# Patient Record
Sex: Male | Born: 1985 | Race: White | Hispanic: No | Marital: Married | Smoking: Never smoker
Health system: Southern US, Community
[De-identification: ages and names within clinical notes are randomized; demographics above are authoritative.]

## PROBLEM LIST (undated history)

## (undated) DIAGNOSIS — K589 Irritable bowel syndrome without diarrhea: Secondary | ICD-10-CM

## (undated) HISTORY — PX: WISDOM TOOTH EXTRACTION: SHX21

## (undated) HISTORY — DX: Irritable bowel syndrome, unspecified: K58.9

---

## 2014-12-18 ENCOUNTER — Ambulatory Visit
Admit: 2014-12-18 | Disposition: A | Discharge status: Home / Self Care | Payer: Self-pay | Attending: Family Medicine | Admitting: Family Medicine

## 2015-03-02 ENCOUNTER — Ambulatory Visit (INDEPENDENT_AMBULATORY_CARE_PROVIDER_SITE_OTHER): Payer: BC Managed Care – PPO | Admitting: Family Medicine

## 2015-03-02 ENCOUNTER — Encounter: Payer: Self-pay | Admitting: Family Medicine

## 2015-03-02 VITALS — BP 110/66 | HR 72 | Ht 73.0 in | Wt 136.0 lb

## 2015-03-02 DIAGNOSIS — M2241 Chondromalacia patellae, right knee: Secondary | ICD-10-CM | POA: Diagnosis not present

## 2015-03-02 DIAGNOSIS — R1030 Lower abdominal pain, unspecified: Secondary | ICD-10-CM

## 2015-03-02 DIAGNOSIS — M75102 Unspecified rotator cuff tear or rupture of left shoulder, not specified as traumatic: Secondary | ICD-10-CM

## 2015-03-02 DIAGNOSIS — R636 Underweight: Secondary | ICD-10-CM

## 2015-03-02 DIAGNOSIS — M79673 Pain in unspecified foot: Secondary | ICD-10-CM

## 2015-03-02 DIAGNOSIS — R1032 Left lower quadrant pain: Secondary | ICD-10-CM

## 2015-03-02 NOTE — Progress Notes (Signed)
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 Seen [facility] 1 month ago for L shoulder pain, told bursitis, given cortisone shot without improvement, still with decreased ROM. C/o persistent R groin pain s/p groin pull, unable to fully flex hip without pain. R knee bothers at times esp with flexion. C/o B plantar foot pain unresposive to standard inserts. Concerned re: inability to gain weight, has been skinny all of life, father and brothers both skinny until middle age.  Review of Systems:  Review of Systems  Constitutional: Negative for fever, activity change, appetite change and fatigue.  HENT: Negative for ear pain and sore throat.   Eyes: Negative for pain.  Respiratory: Negative for shortness of breath.   Cardiovascular: Negative for chest pain and leg swelling.  Gastrointestinal: Negative for abdominal pain.  Genitourinary: Negative for frequency and difficulty urinating.  Skin: Negative for rash.  Neurological: Negative for tremors and syncope.  Hematological: Negative for adenopathy.  Psychiatric/Behavioral: Negative for decreased concentration.    There are no active problems to display for this patient.   Prior to Admission medications   Not on File    Not on File  No past surgical history on file.  History  Substance Use Topics  . Smoking status: Never Smoker   . Smokeless tobacco: Not on file  . Alcohol Use: Not on file    No family history on file.  Medication list has been reviewed and updated.  Physical Examination: BP 110/66 mmHg  Pulse 72  Ht 6\' 1"  (1.854 m)  Wt 136 lb (61.689 kg)  BMI 17.95 kg/m2  Physical Exam  Constitutional: He appears well-developed and well-nourished. No distress.  HENT:  Head: Normocephalic and atraumatic.  Right Ear:  External ear normal.  Left Ear: External ear normal.  Mouth/Throat: Oropharynx is clear and moist.  Eyes: Conjunctivae and EOM are normal. Pupils are equal, round, and reactive to light.  Neck: Neck supple. No thyromegaly present.  Cardiovascular: Normal rate, regular rhythm and normal heart sounds.   Pulmonary/Chest: Effort normal and breath sounds normal.  Abdominal: Soft. He exhibits no distension and no mass. There is no tenderness.  Musculoskeletal: He exhibits no edema.  Unable to abduct L shoulder past 90 deg without pain, mild tenderness over biceps insertion, R knee stable, some pain with inferior patellar movement  Lymphadenopathy:    He has no cervical adenopathy.    Assessment and Plan:  1. Rotator cuff syndrome of left shoulder Unresponsive cortisone shot at [facility] - Ambulatory referral to Physical Therapy  2. Left groin pain - Ambulatory referral to Physical Therapy  3. Chondromalacia patellae of right knee - Ambulatory referral to Physical Therapy  4. Foot arch pain, unspecified laterality - Ambulatory referral to Podiatry  5. Underweight Likely genetic, has had blood done multiple times  Return in about 6 months (around 09/01/2015).  Dionne Ano. Kingsley Spittle MD [facility]  03/02/2015

## 2015-04-02 ENCOUNTER — Encounter: Payer: BC Managed Care – PPO | Admitting: Family Medicine

## 2015-04-03 ENCOUNTER — Ambulatory Visit: Payer: BC Managed Care – PPO | Attending: Family Medicine | Admitting: Physical Therapy

## 2015-04-03 DIAGNOSIS — M25612 Stiffness of left shoulder, not elsewhere classified: Secondary | ICD-10-CM | POA: Diagnosis not present

## 2015-04-03 DIAGNOSIS — M25512 Pain in left shoulder: Secondary | ICD-10-CM | POA: Insufficient documentation

## 2015-04-03 DIAGNOSIS — M6281 Muscle weakness (generalized): Secondary | ICD-10-CM | POA: Insufficient documentation

## 2015-04-03 DIAGNOSIS — R1032 Left lower quadrant pain: Secondary | ICD-10-CM

## 2015-04-03 DIAGNOSIS — R1030 Lower abdominal pain, unspecified: Secondary | ICD-10-CM | POA: Diagnosis not present

## 2015-04-04 ENCOUNTER — Encounter: Payer: Self-pay | Admitting: Physical Therapy

## 2015-04-04 NOTE — Therapy (Signed)
[facility] [address] [place], [place], [address] Phone: [phone]   Fax:  [phone]  Physical Therapy Evaluation  Patient Details  Name: Nathan Guerrero MRN: [id number] Date of Birth: [birth year] Referring Provider:  Schuyler Amor, MD  Encounter Date: 04/03/2015      PT End of Session - 04/04/15 1716    Visit Number 1   Number of Visits 8   Date for PT Re-Evaluation 05/01/15   PT Start Time 0809   PT Stop Time 0917   PT Time Calculation (min) 68 min   Activity Tolerance Patient tolerated treatment well;Patient limited by pain   Behavior During Therapy [facility] for tasks assessed/performed      History reviewed. No pertinent past medical history.  History reviewed. No pertinent past surgical history.  There were no vitals filed for this visit.  Visit Diagnosis:  Left shoulder pain  Muscle weakness  Stiffness of joint, shoulder region, left  Left groin pain      Subjective Assessment - 04/04/15 1712    Subjective Pts. primary complaint at this time is L shoulder pain (6/10).  Pt. reports no know MOI but pain started end of May and has progressively worsened.  Pt. states "even picking up a glass of water" hurts shoulder.  Pt. also has c/o L groin pain and no c/o B knee pain at this time.    Limitations Lifting;Standing;Walking;House hold activities;Other (comment)   Diagnostic tests No MRI   Patient Stated Goals Increase L shoulder ROM/ stability/ pain mgmt.     Currently in Pain? Yes   Pain Score 6    Pain Location Shoulder   Pain Orientation Left   Pain Descriptors / Indicators Sharp   Pain Type Chronic pain   Pain Onset More than a month ago   Pain Frequency Intermittent   Aggravating Factors  overhead reaching/ unable to play golf of lacrosse at this time      SEE HEP HANDOUTS      PT Education - 04/04/15 1715    Education provided Yes   Education Details see HEP handouts/ ice to L shoulder    Person(s) Educated Patient   Methods Explanation;Demonstration;Handout   Comprehension Verbalized understanding;Returned demonstration             PT Long Term Goals - 04/04/15 1724    PT LONG TERM GOAL #1   Title Pt. I with HEP to increase L shoulder AROM to WNL as compared to R shoulder to improve pain-free mobility.   Baseline L sh. flexion/ abd. 110 deg.   Time 4   Period Weeks   Status New   PT LONG TERM GOAL #2   Title Pt. able to golf/ play lacrosse with on L shoulder pain/ limitations.    Time 4   Period Weeks   Status New   PT LONG TERM GOAL #3   Title Pt. will decrease QuickDASH to <25% to improve pain-free mobility with L shoulder/UE.     Baseline QuickDASH 70.5% on 7/26   Time 4   Period Weeks   Status New   PT LONG TERM GOAL #4   Title Pt. will incrase LEFS to >60 out of 80 to improve running/ return to sport.     Baseline LEFS: 39 out of 80 on 7/26.  L groin pain.    Time 4   Period Weeks   Status New  Plan - 04/04/15 1717    Clinical Impression Statement Pt. is an active 29 y/o male with >2 months of L shoulder pain/ joint stiffness with no reported MOI.  Pt. c/o 6/10 L shoulder pain currently at rest and "sharp" increase in L posterior/ capsular pain with shoulder flexion/ sleeping position/ lifting.  Pt. received 1 cortisone injection in L sh. at Urgent Care with no benefit.  Seated L shoulder flexion/ abd. limited by significant pain/ capsular stiffness.  (+) Neers.Hawkins impingement test.  (+) tenderness in L proximal biceps/ posterior deltoid region.  No swelling or bruiseing noted.  R shoulder AROM/ strength WNL.  Strength testing in L shoulder limited by pain/ muscle guarding.  Pt. will benefit from skilled PT services to increase L shoulder capsular mobility/ pain-free movement and overall strength to promote return to functional mobility/ sports.    Pt will benefit from skilled therapeutic intervention in order to improve on the  following deficits Hypomobility;Decreased activity tolerance;Decreased strength;Impaired UE functional use;Pain;Decreased mobility;Decreased range of motion   Rehab Potential Good   PT Frequency 2x / week   PT Duration 4 weeks   PT Treatment/Interventions Moist Heat;Therapeutic exercise;Manual techniques;Therapeutic activities;Iontophoresis /ml Dexamethasone;Electrical Stimulation;Functional mobility training;Dry needling;Cryotherapy;Patient/family education;Passive range of motion;ADLs/Self Care Home Management   PT Next Visit Plan reassess HEP/ issue pulley ex./ manual tx. to increase pain-free mobility of L shoulder.  Discuss L groin.     PT Home Exercise Plan see handouts.         Problem List There are no active problems to display for this patient.  Cammie Mcgee, PT, DPT # [phone]   04/04/2015, 5:29 PM  [facility] [address] [place], [place], [address] Phone: [phone]   Fax:  [phone]

## 2015-04-05 ENCOUNTER — Encounter: Payer: BC Managed Care – PPO | Admitting: Family Medicine

## 2015-04-05 ENCOUNTER — Ambulatory Visit: Payer: BC Managed Care – PPO | Admitting: Physical Therapy

## 2015-04-05 DIAGNOSIS — M25612 Stiffness of left shoulder, not elsewhere classified: Secondary | ICD-10-CM

## 2015-04-05 DIAGNOSIS — M25512 Pain in left shoulder: Secondary | ICD-10-CM

## 2015-04-05 DIAGNOSIS — M6281 Muscle weakness (generalized): Secondary | ICD-10-CM

## 2015-04-05 DIAGNOSIS — R1032 Left lower quadrant pain: Secondary | ICD-10-CM

## 2015-04-06 NOTE — Therapy (Signed)
[place] [facility] [address] [place], [place], [address] Phone: [phone]   Fax:  [phone]  Physical Therapy Treatment  Patient Details  Name: Nathan Guerrero MRN: [id number] Date of Birth: 01/23/1986 Referring Provider:  Schuyler Amor, MD  Encounter Date: 04/05/2015      PT End of Session - 04/06/15 2026    Visit Number 2   Number of Visits 8   Date for PT Re-Evaluation 05/01/15   PT Start Time 1112   PT Stop Time 1203   PT Time Calculation (min) 51 min   Activity Tolerance Patient tolerated treatment well;Patient limited by pain   Behavior During Therapy [facility] for tasks assessed/performed      No past medical history on file.  No past surgical history on file.  There were no vitals filed for this visit.  Visit Diagnosis:  Left shoulder pain  Muscle weakness  Stiffness of joint, shoulder region, left  Left groin pain      Subjective Assessment - 04/06/15 2019    Subjective Pt. states L shoulder is hurting with certain overhead/ horizontal adduction movement patterns.  Pt. has been minimally using L shoulder with daily ADL/ work tasks.  Pt. states L groin hurts but shoulder is primary concern.    Limitations Lifting;Standing;Walking;House hold activities;Other (comment)   Diagnostic tests No MRI   Patient Stated Goals Increase L shoulder ROM/ stability/ pain mgmt.     Currently in Pain? Yes   Pain Score 5    Pain Location Shoulder   Pain Orientation Left        OBJECTIVE:  There.ex.: Reviewed HEP/ discuss static groin stretches in supine/ standing.  Seated shoulder pulley AAROM (flexion/ abd.)- see handout.  Manual tx: supine L shoulder PA/AP/inf. Grade III mobs. 5x20 sec.  L shoulder AA/PROM all planes as tolerated.  STM to L shoulder deltoid/ biceps/ triceps in supine position.      Pt response for medical necessity:  Marked increase in L shoulder AAROM with HEP.  (+) L posterior shoulder tenderness/  capsule tightness.           PT Long Term Goals - 04/04/15 1724    PT LONG TERM GOAL #1   Title Pt. I with HEP to increase L shoulder AROM to WNL as compared to R shoulder to improve pain-free mobility.   Baseline L sh. flexion/ abd. 110 deg.   Time 4   Period Weeks   Status New   PT LONG TERM GOAL #2   Title Pt. able to golf/ play lacrosse with on L shoulder pain/ limitations.    Time 4   Period Weeks   Status New   PT LONG TERM GOAL #3   Title Pt. will decrease QuickDASH to <25% to improve pain-free mobility with L shoulder/UE.     Baseline QuickDASH 70.5% on 7/26   Time 4   Period Weeks   Status New   PT LONG TERM GOAL #4   Title Pt. will incrase LEFS to >60 out of 80 to improve running/ return to sport.     Baseline LEFS: 39 out of 80 on 7/26.  L groin pain.    Time 4   Period Weeks   Status New            Plan - 04/06/15 2028    Clinical Impression Statement Marked improvement in pain-free L shoulder AAROM with use of shoulder pulley (flexion/ abd.) in seated position.  L posterior  deltoid/ shoulder capsule pain and tenderness with palpation. Pt. instructed in proper statich hip adductor stretches in supine/ standing position.     Pt will benefit from skilled therapeutic intervention in order to improve on the following deficits Hypomobility;Decreased activity tolerance;Decreased strength;Impaired UE functional use;Pain;Decreased mobility;Decreased range of motion   Rehab Potential Good   PT Frequency 2x / week   PT Duration 4 weeks   PT Treatment/Interventions Moist Heat;Therapeutic exercise;Manual techniques;Therapeutic activities;Iontophoresis /ml Dexamethasone;Electrical Stimulation;Functional mobility training;Dry needling;Cryotherapy;Patient/family education;Passive range of motion;ADLs/Self Care Home Management   PT Next Visit Plan reassess HEP/ manual tx. to increase pain-free mobility of L shoulder.  Discuss L groin.     PT Home Exercise Plan see  handouts.        Problem List There are no active problems to display for this patient.  Cammie Mcgee, PT, DPT # [phone]   04/06/2015, 8:31 PM  [facility] [address] [place], [place], [address] Phone: [phone]   Fax:  [phone]

## 2015-04-10 ENCOUNTER — Ambulatory Visit: Payer: BC Managed Care – PPO | Attending: Family Medicine | Admitting: Physical Therapy

## 2015-04-10 ENCOUNTER — Ambulatory Visit: Payer: BC Managed Care – PPO | Admitting: Physical Therapy

## 2015-04-10 DIAGNOSIS — R1030 Lower abdominal pain, unspecified: Secondary | ICD-10-CM | POA: Diagnosis present

## 2015-04-10 DIAGNOSIS — M25512 Pain in left shoulder: Secondary | ICD-10-CM

## 2015-04-10 DIAGNOSIS — R1032 Left lower quadrant pain: Secondary | ICD-10-CM

## 2015-04-10 DIAGNOSIS — M6281 Muscle weakness (generalized): Secondary | ICD-10-CM | POA: Insufficient documentation

## 2015-04-10 DIAGNOSIS — M25612 Stiffness of left shoulder, not elsewhere classified: Secondary | ICD-10-CM | POA: Diagnosis present

## 2015-04-10 NOTE — Therapy (Signed)
[facility] [address] [place], [place], [address] Phone: [phone]   Fax:  [phone]  Physical Therapy Treatment  Patient Details  Name: Semaj Coburn MRN: [id number] Date of Birth: [birth year] Referring Provider:  Schuyler Amor, MD  Encounter Date: 04/10/2015      PT End of Session - 04/10/15 1702    Visit Number 3   Number of Visits 8   Date for PT Re-Evaluation 05/01/15   PT Start Time 1502   PT Stop Time 1555   PT Time Calculation (min) 53 min   Activity Tolerance Patient tolerated treatment well;Patient limited by pain   Behavior During Therapy [facility] for tasks assessed/performed      No past medical history on file.  No past surgical history on file.  There were no vitals filed for this visit.  Visit Diagnosis:  Left shoulder pain  Muscle weakness  Stiffness of joint, shoulder region, left  Left groin pain      Subjective Assessment - 04/10/15 1504    Subjective Pt reports L shoulder pain at 2/10. Pain with overhead and horizontal abduction in range of 100 degrees. Pt reports pain with reaching backwards on the posterior side of his shoulder. Pt reports L groin feels like it is plateauing.    Limitations Lifting;Standing;Walking;House hold activities;Other (comment)   Diagnostic tests No MRI   Patient Stated Goals Increase L shoulder ROM/ stability/ pain mgmt.     Currently in Pain? Yes   Pain Score 2    Pain Location Shoulder   Pain Orientation Left;Posterior   Pain Descriptors / Indicators Sharp   Pain Type Chronic pain   Pain Onset More than a month ago       OBJECTIVE: Manual tx: Prone: Soft tissue to posterior deltoid, triceps and infraspinatus. Supine: GH joint distraction with circular motion, inferior glide with abduction, posterior, anterior grade III 30 seconds x 5 each. Isometric IR in a pain free range.  PROM stretching in shoulder flexion with depression.  AROM with depression.  There ex: seated scapular retraction with cuing for proper technique and posture, horizontal abduction x 10 each.    Pt response for medical necessity: Increased capsular movement with manual treatment to increase ROM (no pain), (+) L posterior deltoid tenderness, limited IR.         PT Long Term Goals - 04/04/15 1724    PT LONG TERM GOAL #1   Title Pt. I with HEP to increase L shoulder AROM to WNL as compared to R shoulder to improve pain-free mobility.   Baseline L sh. flexion/ abd. 110 deg.   Time 4   Period Weeks   Status New   PT LONG TERM GOAL #2   Title Pt. able to golf/ play lacrosse with on L shoulder pain/ limitations.    Time 4   Period Weeks   Status New   PT LONG TERM GOAL #3   Title Pt. will decrease QuickDASH to <25% to improve pain-free mobility with L shoulder/UE.     Baseline QuickDASH 70.5% on 7/26   Time 4   Period Weeks   Status New   PT LONG TERM GOAL #4   Title Pt. will incrase LEFS to >60 out of 80 to improve running/ return to sport.     Baseline LEFS: 39 out of 80 on 7/26.  L groin pain.    Time 4   Period Weeks   Status New  Problem List There are no active problems to display for this patient.  Cammie Mcgee, PT, DPT # [phone]   04/10/2015, 5:06 PM  [place] [facility] [address] [place], [place], [address] Phone: [phone]   Fax:  [phone]

## 2015-04-12 ENCOUNTER — Ambulatory Visit: Payer: BC Managed Care – PPO | Admitting: Physical Therapy

## 2015-04-12 ENCOUNTER — Encounter: Payer: Self-pay | Admitting: Physical Therapy

## 2015-04-12 DIAGNOSIS — M25512 Pain in left shoulder: Secondary | ICD-10-CM

## 2015-04-12 DIAGNOSIS — M25612 Stiffness of left shoulder, not elsewhere classified: Secondary | ICD-10-CM

## 2015-04-12 DIAGNOSIS — R1032 Left lower quadrant pain: Secondary | ICD-10-CM

## 2015-04-12 DIAGNOSIS — M6281 Muscle weakness (generalized): Secondary | ICD-10-CM

## 2015-04-12 NOTE — Therapy (Signed)
[place] [facility] [address] [place], [place], [address] Phone: [phone]   Fax:  [phone]  Physical Therapy Treatment  Patient Details  Name: Graig Hessling MRN: [id number] Date of Birth: 29-Sep-1985 Referring Provider:  Schuyler Amor, MD  Encounter Date: 04/12/2015      PT End of Session - 04/12/15 0738    Visit Number 4   Number of Visits 8   Date for PT Re-Evaluation 05/01/15   PT Start Time 0732   PT Stop Time 0828   PT Time Calculation (min) 56 min   Activity Tolerance Patient tolerated treatment well;Patient limited by pain   Behavior During Therapy [facility] for tasks assessed/performed      History reviewed. No pertinent past medical history.  History reviewed. No pertinent past surgical history.  There were no vitals filed for this visit.  Visit Diagnosis:  Left shoulder pain  Muscle weakness  Stiffness of joint, shoulder region, left  Left groin pain      Subjective Assessment - 04/12/15 0734    Subjective Pt reports L shoulder feeling sore Tuesday night and not doing many exercises yesterday. Pt reports tightness and stiffness in the AM and rates it at a 3/10.  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    Limitations Lifting;Standing;Walking;House hold activities;Other (comment)   Diagnostic tests No MRI   Patient Stated Goals Increase L shoulder ROM/ stability/ pain mgmt.     Currently in Pain? Yes   Pain Score 3    Pain Location Shoulder   Pain Orientation Left;Posterior   Pain Type Chronic pain   Pain Onset More than a month ago         OBJECTIVE: Manual tx: R sidelying STM to posterior deltoid, triceps and infraspinatus (+ tenderness noted to infraspinatus with all STM).  Dry Needling: L posterior deltoid and infraspinatus  (no c/o increased pain)- minimal pistoning required due to tenderness and STM after needling. PROM: R sidelying  shoulder abduction in the scaption plane.  Supine: GH joint distraction with circular motion, inferior glide with abduction, posterior, anterior grade III 30 seconds x 5 each. Isometric IR in a pain free range. PROM stretching in shoulder flexion with depression. AROM with depression. STM with Biofreeze to end the session.  There ex: seated pulleys forwards and sideways x 3 mins each direction. Standing scapular retraction with verbal cuing to decrease upper trap elevation.  Pt response for medical necessity: Increased capsular movement with manual treatment to increase ROM (no pain), (+) L posterior deltoid tenderness and infraspinatus, limited IR. Noted tenderness relief with dry needling and STM.  No tenderness to L tricep noted.          PT Long Term Goals - 04/04/15 1724    PT LONG TERM GOAL #1   Title Pt. I with HEP to increase L shoulder AROM to WNL as compared to R shoulder to improve pain-free mobility.   Baseline L sh. flexion/ abd. 110 deg.   Time 4   Period Weeks   Status New   PT LONG TERM GOAL #2   Title Pt. able to golf/ play lacrosse with on L shoulder pain/ limitations.    Time 4   Period Weeks   Status New   PT LONG TERM GOAL #3   Title Pt. will decrease QuickDASH to <25% to improve pain-free mobility with L shoulder/UE.     Baseline QuickDASH 70.5% on 7/26  Time 4   Period Weeks   Status New   PT LONG TERM GOAL #4   Title Pt. will incrase LEFS to >60 out of 80 to improve running/ return to sport.     Baseline LEFS: 39 out of 80 on 7/26.  L groin pain.    Time 4   Period Weeks   Status New     Problem List There are no active problems to display for this patient.  Cammie Mcgee, PT, DPT # [phone]   04/12/2015, 8:43 AM  [place] [facility] [address] [place], [place], [address] Phone: [phone]   Fax:  [phone]

## 2015-04-25 ENCOUNTER — Ambulatory Visit (INDEPENDENT_AMBULATORY_CARE_PROVIDER_SITE_OTHER): Payer: BC Managed Care – PPO | Admitting: Family Medicine

## 2015-04-25 ENCOUNTER — Encounter: Payer: Self-pay | Admitting: Family Medicine

## 2015-04-25 VITALS — BP 118/78 | HR 76 | Ht 71.0 in | Wt 140.0 lb

## 2015-04-25 DIAGNOSIS — M79673 Pain in unspecified foot: Secondary | ICD-10-CM | POA: Diagnosis not present

## 2015-04-25 DIAGNOSIS — R1032 Left lower quadrant pain: Secondary | ICD-10-CM

## 2015-04-25 DIAGNOSIS — R1030 Lower abdominal pain, unspecified: Secondary | ICD-10-CM | POA: Diagnosis not present

## 2015-04-25 DIAGNOSIS — M2241 Chondromalacia patellae, right knee: Secondary | ICD-10-CM

## 2015-04-25 DIAGNOSIS — M75102 Unspecified rotator cuff tear or rupture of left shoulder, not specified as traumatic: Secondary | ICD-10-CM | POA: Diagnosis not present

## 2015-04-25 DIAGNOSIS — R636 Underweight: Secondary | ICD-10-CM | POA: Diagnosis not present

## 2015-04-25 NOTE — Progress Notes (Signed)
Date:  04/25/2015   Name:  Nathan Guerrero [facility]   DOB:  09-Nov-1985   MRN:  [id number]  PCP:  Schuyler Amor, MD    Chief Complaint: Annual Exam   History of Present Illness:  This is a 28 y.o. male for f/u L shoulder and groin pain. Has seen PT for 4-5 visits, feels shoulder and groin improved but not where he wants them to be. Continuing exercises at home. Saw podiatry and inserts helping a lot. Father/mother/sibs all healthy. Drinks 1-2 beers nighty and more on weekend. Had blood work done last year, all ok. Tetanus about 5 yrs ago.  Review of Systems:  Review of Systems  Patient Active Problem List   Diagnosis Date Noted  . Rotator cuff syndrome of left shoulder 04/25/2015  . Underweight 04/25/2015    Prior to Admission medications   Medication Sig Start Date End Date Taking? Authorizing Provider  Multiple Vitamin (MULTIVITAMIN) capsule Take 1 capsule by mouth daily.   Yes Historical Provider, MD    No Known Allergies  No past surgical history on file.  Social History  Substance Use Topics  . Smoking status: Never Smoker   . Smokeless tobacco: None  . Alcohol Use: 0.0 oz/week    0 Standard drinks or equivalent per week    No family history on file.  Medication list has been reviewed and updated.  Physical Examination: BP 118/78 mmHg  Pulse 76  Ht  (1.803 m)  Wt 140 lb (63.504 kg)  BMI 19.53 kg/m2  Physical Exam  Constitutional: He appears well-developed and well-nourished.  Cardiovascular: Normal rate, regular rhythm and normal heart sounds.   Pulmonary/Chest: Effort normal and breath sounds normal.  Musculoskeletal: He exhibits no edema.  Neurological: He is alert.  Skin: Skin is warm and dry. No rash noted.  Psychiatric: He has a normal mood and affect. His behavior is normal.    Assessment and Plan:  1. Rotator cuff syndrome of left shoulder Continue home exercises/PT as needed  2. Foot arch pain, unspecified laterality Improved with  inserts per podiatry  3. Chondromalacia patellae of right knee Improved with PT  4. Left groin pain Improved with PT  5. Underweight Discussed alcohol use, advised no more than 2 drinks on average per day   Return in about 1 year (around 04/24/2016).  Dionne Ano. Kingsley Spittle MD [facility]  04/25/2015

## 2015-07-23 ENCOUNTER — Ambulatory Visit
Admission: EM | Admit: 2015-07-23 | Discharge: 2015-07-23 | Discharge status: Absent without leave | Payer: BC Managed Care – PPO

## 2015-11-16 ENCOUNTER — Ambulatory Visit (INDEPENDENT_AMBULATORY_CARE_PROVIDER_SITE_OTHER): Payer: BC Managed Care – PPO | Admitting: Family Medicine

## 2015-11-16 ENCOUNTER — Encounter: Payer: Self-pay | Admitting: Family Medicine

## 2015-11-16 VITALS — BP 116/78 | HR 64 | Temp 97.9°F | Resp 16 | Ht 71.0 in | Wt 147.2 lb

## 2015-11-16 DIAGNOSIS — B09 Unspecified viral infection characterized by skin and mucous membrane lesions: Secondary | ICD-10-CM

## 2015-11-16 MED ORDER — PREDNISONE 20 MG PO TABS: 20.0000 mg | ORAL_TABLET | Freq: Two times a day (BID) | ORAL | Status: DC

## 2015-11-16 NOTE — Progress Notes (Signed)
Date:  11/16/2015   Name:  Nathan Guerrero   DOB:  02-14-1986   MRN:  [id number]  PCP:  Schuyler AmorWilliam Chelsea Pedretti, MD    Chief Complaint: Rash   History of Present Illness:  This is a 30 y.o. male with 5d hx diffuse pruritic rash getting worse, no known exposures. Had LG fever and fatigue past 3d, had varicella as child. Itching keeping up at night.  Review of Systems:  Review of Systems  HENT: Negative for rhinorrhea and sore throat.   Respiratory: Negative for cough.   Musculoskeletal: Negative for myalgias.    Patient Active Problem List   Diagnosis Date Noted  . Rotator cuff syndrome of left shoulder 04/25/2015  . Underweight 04/25/2015    Prior to Admission medications   Medication Sig Start Date End Date Taking? Authorizing Provider  acetaminophen (TYLENOL) 325 MG tablet Take 650 mg by mouth every 6 (six) hours as needed.   Yes Historical Provider, MD  Multiple Vitamin (MULTIVITAMIN) capsule Take 1 capsule by mouth daily.   Yes Historical Provider, MD  predniSONE (DELTASONE) 20 MG tablet Take 1 tablet (20 mg total) by mouth 2 (two) times daily with a meal. 11/16/15   Schuyler AmorWilliam Lyndell Gillyard, MD    No Known Allergies  History reviewed. No pertinent past surgical history.  Social History  Substance Use Topics  . Smoking status: Never Smoker   . Smokeless tobacco: None  . Alcohol Use: 0.0 oz/week    0 Standard drinks or equivalent per week    History reviewed. No pertinent family history.  Medication list has been reviewed and updated.  Physical Examination: BP 116/78 mmHg  Pulse 64  Temp(Src) 97.9 F (36.6 C) (Oral)  Resp 16  Ht 5\' 11"  (1.803 m)  Wt 147 lb 3.2 oz (66.769 kg)  BMI 20.54 kg/m2  SpO2 98%  Physical Exam  Constitutional: He appears well-developed and well-nourished.  Neurological: He is alert.  Skin: Skin is warm and dry.  Punctate erythematous lesions diffusely on B arms, legs, chest  Psychiatric: He has a normal mood and affect. His behavior is  normal.  Nursing note and vitals reviewed.   Assessment and Plan:  1. Viral exanthem Prednisone 20 mg bid x 5d for pruritis, call if no better by Monday  Return if symptoms worsen or fail to improve.  Dionne AnoWilliam M. Kingsley SpittlePlonk, Jr. MD [facility]  11/16/2015

## 2016-03-07 ENCOUNTER — Ambulatory Visit: Payer: BC Managed Care – PPO | Admitting: Family Medicine

## 2016-08-06 ENCOUNTER — Encounter: Payer: Self-pay | Admitting: Internal Medicine

## 2016-08-06 ENCOUNTER — Ambulatory Visit (INDEPENDENT_AMBULATORY_CARE_PROVIDER_SITE_OTHER): Payer: BC Managed Care – PPO | Admitting: Internal Medicine

## 2016-08-06 VITALS — BP 110/76 | HR 84 | Resp 16 | Ht 71.0 in | Wt 159.4 lb

## 2016-08-06 DIAGNOSIS — L237 Allergic contact dermatitis due to plants, except food: Secondary | ICD-10-CM | POA: Diagnosis not present

## 2016-08-06 MED ORDER — PREDNISONE 10 MG PO TABS: ORAL_TABLET | ORAL | 0 refills | Status: DC

## 2016-08-06 NOTE — Patient Instructions (Signed)
Take Benadryl at bedtime for itching.

## 2016-08-06 NOTE — Progress Notes (Signed)
    Date:  08/06/2016   Name:  Nathan LeveeJustin Guerrero [employer] Guerrero   DOB:  09/05/1986   MRN:  [id number]   Chief Complaint: Poison Ivy (Started Monday night after yard work ) [employer]  This is a new problem. The current episode started in the past 7 days. The problem has been gradually worsening since onset. The rash is diffuse. The rash is characterized by blistering, itchiness and redness. He was exposed to plant contact (worked in his yard where he knows there is poison ivy). Pertinent negatives include no fatigue, fever or shortness of breath. Past treatments include anti-itch cream. The treatment provided no relief.    Review of Systems  Constitutional: Negative for chills, fatigue and fever.  Respiratory: Negative for choking and shortness of breath.   Cardiovascular: Negative for chest pain.  Skin: Positive for rash.    Patient Active Problem List   Diagnosis Date Noted  . Rotator cuff syndrome of left shoulder 04/25/2015  . Underweight 04/25/2015    Prior to Admission medications   Medication Sig Start Date End Date Taking? Authorizing Provider  acetaminophen (TYLENOL) 325 MG tablet Take 650 mg by mouth every 6 (six) hours as needed.   Yes Historical Provider, MD  Multiple Vitamin (MULTIVITAMIN) capsule Take 1 capsule by mouth daily.   Yes Historical Provider, MD    No Known Allergies  History reviewed. No pertinent surgical history.  Social History  Substance Use Topics  . Smoking status: Never Smoker  . Smokeless tobacco: Never Used  . Alcohol use 0.0 oz/week     Medication list has been reviewed and updated.   Physical Exam  Constitutional: He is oriented to person, place, and time. He appears well-developed. No distress.  HENT:  Head: Normocephalic and atraumatic.  Cardiovascular: Normal rate, regular rhythm and normal heart sounds.   Pulmonary/Chest: Effort normal and breath sounds normal. No respiratory distress.  Musculoskeletal: Normal range of motion.    Neurological: He is alert and oriented to person, place, and time.  Skin: Skin is warm and dry. Rash noted.  Scattered red vesicles in somewhat linear pattern over both arms, back, buttocks and abdomen.  Psychiatric: He has a normal mood and affect. His behavior is normal. Thought content normal.  Nursing note and vitals reviewed.   BP 110/76   Pulse 84   Resp 16   Ht 5\' 11"  (1.803 m)   Wt 159 lb 6.4 oz (72.3 kg)   SpO2 97%   BMI 22.23 kg/m   Assessment and Plan: 1. Allergic contact dermatitis due to plants, except food Recommend benadryl at night  - predniSONE (DELTASONE) 10 MG tablet; Take 6 on day 1, 5 on day 2, 4 on day 3, 3 on day 4, 2 on day 5 and 1 on day 1 then stop.  Dispense: 21 tablet; Refill: 0   Bari EdwardLaura Marka Treloar, MD [facility]  08/06/2016

## 2016-08-21 ENCOUNTER — Ambulatory Visit (INDEPENDENT_AMBULATORY_CARE_PROVIDER_SITE_OTHER): Payer: BC Managed Care – PPO | Admitting: Internal Medicine

## 2016-08-21 ENCOUNTER — Other Ambulatory Visit: Payer: Self-pay | Admitting: *Deleted

## 2016-08-21 ENCOUNTER — Encounter: Payer: Self-pay | Admitting: Internal Medicine

## 2016-08-21 VITALS — BP 128/82 | HR 78 | Temp 97.6°F | Ht 72.0 in | Wt 158.0 lb

## 2016-08-21 DIAGNOSIS — K121 Other forms of stomatitis: Secondary | ICD-10-CM

## 2016-08-21 MED ORDER — FLUCONAZOLE 100 MG PO TABS: 100.0000 mg | ORAL_TABLET | Freq: Every day | ORAL | 0 refills | Status: DC

## 2016-08-21 MED ORDER — MAGIC MOUTHWASH W/LIDOCAINE: 10.0000 mL | Freq: Four times a day (QID) | ORAL | 0 refills | Status: DC

## 2016-08-21 NOTE — Progress Notes (Signed)
Date:  08/21/2016   Name:  [facility]in [facility]   DOB:  1986-06-01   MRN:  [id number]   Chief Complaint: Mouth Lesions (Pt stated having cold sore inside the mouth/throat for about 3 days) Mouth Lesions   The current episode started 3 to 5 days ago. The onset was sudden. The problem occurs continuously. The problem has been gradually worsening. The problem is severe. Nothing relieves the symptoms. The symptoms are aggravated by eating and drinking. Associated symptoms include mouth sores and swollen glands. Pertinent negatives include no fever, no eye itching, no congestion, no ear discharge, no headaches, no sore throat, no cough and no eye pain. There were no sick contacts. Recently, medical care has been given by the PCP. Services received include medications given (prednisone).  He has hx of intermittent aphthous ulcers but only one at a time.  He also gets blistering and splitting of the middle of his lower lip with sun exposure.  Never been dx'd as herpes.  No hx of genital lesions.    Review of Systems  Constitutional: Negative for fever.  HENT: Positive for mouth sores. Negative for congestion, ear discharge and sore throat.   Eyes: Negative for pain and itching.  Respiratory: Negative for cough and chest tightness.   Cardiovascular: Negative for chest pain.  Neurological: Negative for headaches.    Patient Active Problem List   Diagnosis Date Noted  . Rotator cuff syndrome of left shoulder 04/25/2015  . Underweight 04/25/2015    Prior to Admission medications   Medication Sig Start Date End Date Taking? Authorizing Provider  acetaminophen (TYLENOL) 325 MG tablet Take 650 mg by mouth every 6 (six) hours as needed.   Yes Historical Provider, MD  Multiple Vitamin (MULTIVITAMIN) capsule Take 1 capsule by mouth daily.   Yes Historical Provider, MD    No Known Allergies  Past Surgical History:  Procedure Laterality Date  . WISDOM TOOTH EXTRACTION      Social History    Substance Use Topics  . Smoking status: Never Smoker  . Smokeless tobacco: Never Used  . Alcohol use 0.0 oz/week     Medication list has been reviewed and updated.   Physical Exam  Constitutional: He is oriented to person, place, and time. He appears well-developed. He appears distressed.  HENT:  Head: Normocephalic and atraumatic.  Small ulcerations inner lower lip, along gum line both upper lateral areas.  White plaque on left buccal mucosa.  Tongue appears normal.  Lips chapped but no lesions.  Cardiovascular: Normal rate, regular rhythm and normal heart sounds.   Pulmonary/Chest: Effort normal and breath sounds normal. No respiratory distress.  Musculoskeletal: Normal range of motion.  Lymphadenopathy:    He has no cervical adenopathy.  Neurological: He is alert and oriented to person, place, and time.  Skin: Skin is warm and dry. No rash noted.  Psychiatric: He has a normal mood and affect. His behavior is normal. Thought content normal.  Nursing note and vitals reviewed.   BP 128/82   Pulse 78   Temp 97.6 F (36.4 C)   Ht 6' (1.829 m)   Wt 158 lb (71.7 kg)   BMI 21.43 kg/m   Assessment and Plan: 1. Mouth ulcers Likely aphthous but suspicious for thrush as well I believe he may also have herpes simplex intermittently - fluconazole (DIFLUCAN) 100 MG tablet; Take 1 tablet (100 mg total) by mouth daily.  Dispense: 3 tablet; Refill: 0 - magic mouthwash w/lidocaine SOLN; Take 10  mLs by mouth 4 (four) times daily.  Dispense: 500 mL; Refill: 0   Bari EdwardLaura Marygrace Sandoval, MD [facility]  08/21/2016

## 2017-02-23 ENCOUNTER — Encounter: Payer: BC Managed Care – PPO | Admitting: Internal Medicine

## 2017-05-14 ENCOUNTER — Telehealth: Payer: Self-pay | Admitting: Internal Medicine

## 2017-05-14 NOTE — Telephone Encounter (Signed)
Pt called to set up apt, said he is having chest pain, stomach pain, fatigue was vomiting, I advised him twice to go to urgent care he said he appreciated the feedback but still wanted to set up apt to discuss symptoms with his dr.

## 2017-05-14 NOTE — Telephone Encounter (Signed)
Reviewed and Spoke with Dr Judithann GravesBerglund- if he is unwilling to be seen at ER or UC then we will see him tomorrow for his visit.

## 2017-05-15 ENCOUNTER — Ambulatory Visit (INDEPENDENT_AMBULATORY_CARE_PROVIDER_SITE_OTHER): Payer: BC Managed Care – PPO | Admitting: Internal Medicine

## 2017-05-15 ENCOUNTER — Encounter: Payer: Self-pay | Admitting: Internal Medicine

## 2017-05-15 VITALS — BP 122/68 | HR 73 | Temp 97.7°F | Ht 72.0 in | Wt 151.8 lb

## 2017-05-15 DIAGNOSIS — K529 Noninfective gastroenteritis and colitis, unspecified: Secondary | ICD-10-CM

## 2017-05-15 MED ORDER — ONDANSETRON HCL 4 MG PO TABS: 4.0000 mg | ORAL_TABLET | Freq: Three times a day (TID) | ORAL | 0 refills | Status: DC | PRN

## 2017-05-15 NOTE — Patient Instructions (Signed)

## 2017-05-15 NOTE — Progress Notes (Signed)
Date:  05/15/2017   [facility]  [facility]   DOB:  1986/08/25   MRN:  [id number]   Chief Complaint: Emesis (Started Tuesday- with runny nose, lose of appetite, and bad taste in back of throat. Wednesday- couldn't hold down food- vomiting to the point of yellow bile. Weighed 159 Saturday and has lost weight due to vomitting. Been feeling fatigued, joints are aching (knees, elbows, shoulders) No energy. Yesterday Diarrhea started. Feel shaky, and having sharp pains under [place]. Come and go. Uneasy feeling. Started on Left side (Monday) and then moved yesterday to Right Side.  )  He had onset of upper respiratory type symptoms for several days and then developed nausea and vomiting. He's been able to keep down fluids primarily water but very little food. Sometimes he has vomiting to the point of dry heaves. Over the last several days is also developed a profuse watery diarrhea without mucus or blood. He denies abdominal pain, fever, rash, headache.  Review of Systems  Constitutional: Positive for fatigue. Negative for chills and fever.  Respiratory: Negative for chest tightness, shortness of breath and wheezing.   Cardiovascular: Positive for chest pain. Negative for palpitations.  Gastrointestinal: Positive for diarrhea (started yesterday), nausea and vomiting (started 3 days ago). Negative for abdominal pain.  Genitourinary: Negative for dysuria and hematuria.  Musculoskeletal: Positive for arthralgias.  Psychiatric/Behavioral: Negative for sleep disturbance.    Patient Active Problem List   Diagnosis Date Noted  . Mouth ulcers 08/21/2016  . Rotator cuff syndrome of left shoulder 04/25/2015  . Underweight 04/25/2015    Prior to Admission medications   Medication Sig Start Date End Date Taking? Authorizing Provider  acetaminophen (TYLENOL) 325 MG tablet Take 650 mg by mouth every 6 (six) hours as needed.   Yes [provider]  Multiple Vitamin (MULTIVITAMIN) capsule Take  1 capsule by mouth daily.   Yes [provider]    No Known Allergies  Past Surgical History:  Procedure Laterality Date  . WISDOM TOOTH EXTRACTION      Social History  Substance Use Topics  . Smoking status: Never Smoker  . Smokeless tobacco: Never Used  . Alcohol use 0.0 oz/week     Medication list has been reviewed and updated.  PHQ 2/9 Scores 05/15/2017  PHQ - 2 Score 0    Physical Exam  Constitutional: He is oriented to person, place, and time. He appears well-developed. He has a sickly appearance. No distress.  Cardiovascular: Normal rate, regular rhythm and normal heart sounds.   Pulmonary/Chest: Breath sounds normal. He has no decreased breath sounds. He has no wheezes. He has no rhonchi.  Abdominal: Soft. Normal appearance. Bowel sounds are decreased. There is no hepatosplenomegaly. There is no tenderness. There is no CVA tenderness.  Neurological: He is alert and oriented to person, place, and time.  Skin: Skin is warm, dry and intact. No rash noted.  Normal skin turgor  Psychiatric: He has a normal mood and affect. His speech is normal.    BP 122/68   Pulse 73   Temp 97.7 F (36.5 C)   Ht 6' (1.829 m)   Wt 151 lb 12.8 oz (68.9 kg)   SpO2 99%   BMI 20.59 kg/m   Assessment and Plan: 1. Gastroenteritis With mild electrolyte disturbance but no dehydration Take zofran tid Increase intake of broth, saltines and other salty bland foods Continue adequate fluids such as gatorade or gingerale - alternate with water  - ondansetron (ZOFRAN) 4  MG tablet; Take 1 tablet (4 mg total) by mouth every 8 (eight) hours as needed for nausea or vomiting.  Dispense: 30 tablet; Refill: 0   Meds ordered this encounter  Medications  . ondansetron (ZOFRAN) 4 MG tablet    Sig: Take 1 tablet (4 mg total) by mouth every 8 (eight) hours as needed for nausea or vomiting.    Dispense:  30 tablet    Refill:  0    Partially dictated using Animal nutritionist. Any errors are  unintentional.  [name], MD [facility]  05/15/2017

## 2017-09-16 ENCOUNTER — Encounter: Payer: Self-pay | Admitting: Internal Medicine

## 2017-09-16 ENCOUNTER — Ambulatory Visit (INDEPENDENT_AMBULATORY_CARE_PROVIDER_SITE_OTHER): Payer: BC Managed Care – PPO | Admitting: Internal Medicine

## 2017-09-16 VITALS — BP 122/70 | HR 102 | Ht 72.0 in | Wt 159.0 lb

## 2017-09-16 DIAGNOSIS — R002 Palpitations: Secondary | ICD-10-CM | POA: Diagnosis not present

## 2017-09-16 DIAGNOSIS — R1013 Epigastric pain: Secondary | ICD-10-CM

## 2017-09-16 DIAGNOSIS — F39 Unspecified mood [affective] disorder: Secondary | ICD-10-CM | POA: Diagnosis not present

## 2017-09-16 DIAGNOSIS — R251 Tremor, unspecified: Secondary | ICD-10-CM | POA: Diagnosis not present

## 2017-09-16 MED ORDER — RANITIDINE HCL 150 MG PO TABS: 150.0000 mg | ORAL_TABLET | Freq: Two times a day (BID) | ORAL | 5 refills | Status: DC

## 2017-09-16 MED ORDER — ESCITALOPRAM OXALATE 10 MG PO TABS: 10.0000 mg | ORAL_TABLET | Freq: Every day | ORAL | 1 refills | Status: DC

## 2017-09-16 NOTE — Progress Notes (Signed)
Date:  09/16/2017    Name:  Nathan Guerrero [facility]   DOB:  1986-03-01   MRN:  [id number]   Chief Complaint: Weight Loss (Fatigue along with loss of appetite- 4 days. Has acid taste in the back of throat. Gag reflex is triggered easy. Dry heaving. Been trying to drink plenty of fluids. )    Review of Systems  Constitutional: Positive for diaphoresis. Negative for fever.  Respiratory: Positive for chest tightness, shortness of breath and wheezing. Negative for cough.   Cardiovascular: Positive for chest pain (with deep breathing) and palpitations.  Gastrointestinal: Positive for vomiting (dry heaves - yellow fluid). Negative for diarrhea and nausea.  Endocrine: Negative for polydipsia and polyuria.  Neurological: Positive for tremors (shakiness).  Psychiatric/Behavioral: Positive for sleep disturbance.    Patient Active Problem List   Diagnosis Date Noted  . Mouth ulcers 08/21/2016  . Rotator cuff syndrome of left shoulder 04/25/2015  . Underweight 04/25/2015    Prior to Admission medications   Medication Sig Start Date End Date Taking? Authorizing Provider  acetaminophen (TYLENOL) 325 MG tablet Take 650 mg by mouth every 6 (six) hours as needed.   Yes [provider]  Multiple Vitamin (MULTIVITAMIN) capsule Take 1 capsule by mouth daily.   Yes [provider]    No Known Allergies  Past Surgical History:  Procedure Laterality Date  . WISDOM TOOTH EXTRACTION      Social History   Tobacco Use  . Smoking status: Never Smoker  . Smokeless tobacco: Never Used  Substance Use Topics  . Alcohol use: Yes    Alcohol/week: 0.0 oz  . Drug use: No     Medication list has been reviewed and updated.  PHQ 2/9 Scores 05/15/2017  PHQ - 2 Score 0    Physical Exam  Constitutional: He is oriented to person, place, and time. He appears well-developed and well-nourished. No distress.  HENT:  Head: Normocephalic and atraumatic.  Neck: Normal range of motion.  Neck supple. No thyromegaly present.  Cardiovascular: Normal rate, regular rhythm and normal heart sounds.  Pulmonary/Chest: Effort normal and breath sounds normal. No respiratory distress. He has no wheezes.  Abdominal: Soft. Bowel sounds are normal. He exhibits no distension and no mass. There is no tenderness. There is no rebound and no guarding.  Musculoskeletal: Normal range of motion. He exhibits no edema or tenderness.  Neurological: He is alert and oriented to person, place, and time. He displays tremor (very fine extension tremor L>R likely physiologic). He displays normal reflexes. He exhibits normal muscle tone.  Skin: Skin is warm, dry and intact. No rash noted. He is not diaphoretic.  Psychiatric: His speech is normal and behavior is normal. Judgment and thought content normal. His mood appears anxious. Cognition and memory are normal. He does not exhibit a depressed mood.  Nursing note and vitals reviewed.   BP 122/70   Pulse (!) 102   Ht 6' (1.829 m)   Wt 159 lb (72.1 kg)   SpO2 99%   BMI 21.56 kg/m   Assessment and Plan: 1. Heart palpitations EKG normal - pt reassured - EKG 12-Lead  2. Tremor Likely physiologic - Comprehensive metabolic panel - TSH  3. Dyspepsia Begin H2 blocker - CBC with Differential/Platelet - ranitidine (ZANTAC) 150 MG tablet; Take 1 tablet (150 mg total) by mouth 2 (two) times daily.  Dispense: 30 tablet; Refill: 5  4. Mood disorder (HCC) With somatic and physical sx above - escitalopram (LEXAPRO) 10 MG  tablet; Take 1 tablet (10 mg total) by mouth daily.  Dispense: 30 tablet; Refill: 1   Meds ordered this encounter  Medications  . escitalopram (LEXAPRO) 10 MG tablet    Sig: Take 1 tablet (10 mg total) by mouth daily.    Dispense:  30 tablet    Refill:  1  . ranitidine (ZANTAC) 150 MG tablet    Sig: Take 1 tablet (150 mg total) by mouth 2 (two) times daily.    Dispense:  30 tablet    Refill:  5    Partially dictated using BaristaDragon  software. Any errors are unintentional.  Bari EdwardLaura Sawyer Mentzer, MD [facility]  09/16/2017

## 2017-09-16 NOTE — Patient Instructions (Signed)
Take Lexapro once a day in the morning  Take Ranitidine once a day in the evening

## 2017-09-17 LAB — CBC WITH DIFFERENTIAL/PLATELET
BASOS ABS: 0.1 10*3/uL (ref 0.0–0.2)
Basos: 1 %
EOS (ABSOLUTE): 0.2 10*3/uL (ref 0.0–0.4)
Eos: 2 %
HEMOGLOBIN: 15.4 g/dL (ref 13.0–17.7)
Hematocrit: 44 % (ref 37.5–51.0)
Immature Grans (Abs): 0 10*3/uL (ref 0.0–0.1)
Immature Granulocytes: 0 %
LYMPHS ABS: 2.1 10*3/uL (ref 0.7–3.1)
Lymphs: 26 %
MCH: 30.1 pg (ref 26.6–33.0)
MCHC: 35 g/dL (ref 31.5–35.7)
MCV: 86 fL (ref 79–97)
Monocytes Absolute: 0.7 10*3/uL (ref 0.1–0.9)
Monocytes: 9 %
Neutrophils Absolute: 5 10*3/uL (ref 1.4–7.0)
Neutrophils: 62 %
PLATELETS: 268 10*3/uL (ref 150–379)
RBC: 5.12 x10E6/uL (ref 4.14–5.80)
RDW: 13.3 % (ref 12.3–15.4)
WBC: 8.1 10*3/uL (ref 3.4–10.8)

## 2017-09-17 LAB — COMPREHENSIVE METABOLIC PANEL
ALBUMIN: 5.2 g/dL (ref 3.5–5.5)
ALT: 33 IU/L (ref 0–44)
AST: 24 IU/L (ref 0–40)
Albumin/Globulin Ratio: 2.2 (ref 1.2–2.2)
Alkaline Phosphatase: 89 IU/L (ref 39–117)
BUN/Creatinine Ratio: 23 — ABNORMAL HIGH (ref 9–20)
BUN: 22 mg/dL — AB (ref 6–20)
Bilirubin Total: 0.6 mg/dL (ref 0.0–1.2)
CHLORIDE: 101 mmol/L (ref 96–106)
CO2: 23 mmol/L (ref 20–29)
CREATININE: 0.96 mg/dL (ref 0.76–1.27)
Calcium: 9.9 mg/dL (ref 8.7–10.2)
GFR calc Af Amer: 120 mL/min/{1.73_m2} (ref >59)
GFR calc non Af Amer: 103 mL/min/{1.73_m2} (ref >59)
GLUCOSE: 92 mg/dL (ref 65–99)
Globulin, Total: 2.4 g/dL (ref 1.5–4.5)
Potassium: 4.8 mmol/L (ref 3.5–5.2)
Sodium: 138 mmol/L (ref 134–144)
Total Protein: 7.6 g/dL (ref 6.0–8.5)

## 2017-09-17 LAB — TSH: TSH: 1.18 u[IU]/mL (ref 0.450–4.500)

## 2017-10-19 ENCOUNTER — Ambulatory Visit: Payer: BC Managed Care – PPO | Admitting: Internal Medicine

## 2018-04-20 ENCOUNTER — Ambulatory Visit (INDEPENDENT_AMBULATORY_CARE_PROVIDER_SITE_OTHER): Payer: BC Managed Care – PPO | Admitting: Internal Medicine

## 2018-04-20 ENCOUNTER — Encounter: Payer: Self-pay | Admitting: Internal Medicine

## 2018-04-20 VITALS — BP 104/62 | HR 73 | Ht 72.0 in | Wt 164.0 lb

## 2018-04-20 DIAGNOSIS — K589 Irritable bowel syndrome without diarrhea: Secondary | ICD-10-CM | POA: Insufficient documentation

## 2018-04-20 MED ORDER — DICYCLOMINE HCL 10 MG PO CAPS: 10.0000 mg | ORAL_CAPSULE | Freq: Four times a day (QID) | ORAL | 1 refills | Status: DC | PRN

## 2018-04-20 NOTE — Progress Notes (Signed)
Date:  04/20/2018   Name:  [facility]   DOB:  24-Sep-1985   MRN:  [id number]   Chief Complaint: Abdominal Pain (States he was diagnosed with IBS. Used to take bentyl and would like to begin taking again. [place]. Over last month painful stomach cramps. Mostly in morning. 3-4 weeks having 4-5 times in Nathan day. Nathan Guerrero is not solid.   )  [place]BS - dx[place] in [place]. Had full work up at that time except for colonoscopy.  Flaring up again and used to take Bentyl.  Sharp pains in the lower abdomen in the past.  Seen by GI and had work up but no colonoscopy. Recently having more sx but has not had any medication for the past 5 years.    Review of Systems  Constitutional: Negative for chills, fatigue, fever and unexpected weight change.  HENT: Negative for mouth sores and trouble swallowing.   Respiratory: Negative for chest tightness, shortness of breath and wheezing.   Cardiovascular: Negative for chest pain and palpitations.  Gastrointestinal: Positive for abdominal pain. Negative for blood in stool, constipation, diarrhea, nausea and rectal pain.  Skin: Negative for color change and rash.  Neurological: Negative for dizziness and headaches.  Psychiatric/Behavioral: Negative for sleep disturbance.    Patient Active Problem List   Diagnosis Date Noted  . Mouth ulcers 08/21/2016  . Rotator cuff syndrome of left shoulder 04/25/2015  . Underweight 04/25/2015    No Known Allergies  Past Surgical History:  Procedure Laterality Date  . WISDOM TOOTH EXTRACTION      Social History   Tobacco Use  . Smoking status: Never Smoker  . Smokeless tobacco: Never Used  Substance Use Topics  . Alcohol use: Yes    Alcohol/week: 0.0 standard drinks  . Drug use: No     Medication list has been reviewed and updated.  No outpatient medications have been marked as taking for the 04/20/18 encounter (Office Visit) with Reubin MilanBerglund, Tanis Hensarling H, MD.     [facility] Guerrero 2/9 Scores 09/16/2017 05/15/2017  PHQ - 2 Score 2 0  PHQ- 9 Score 12 -    Physical Exam  Constitutional: He is oriented to person, place, and time. He appears well-developed. No distress.  HENT:  Head: Normocephalic and atraumatic.  Cardiovascular: Normal rate and regular rhythm.  Pulmonary/Chest: Effort normal. No respiratory distress.  Abdominal: Soft. Normal appearance, normal aorta and bowel sounds are normal.  Musculoskeletal: Normal range of motion.  Neurological: He is alert and oriented to person, place, and time.  Skin: Skin is warm and dry. No rash noted.  Psychiatric: He has Nathan normal mood and affect. His behavior is normal. Thought content normal.  Nursing note and vitals reviewed.   BP 104/62   Pulse 73   Ht 6' (1.829 m)   Wt 164 lb (74.4 kg)   SpO2 98%   BMI 22.24 kg/m   Assessment and Plan: 1. Irritable bowel syndrome, unspecified type Take twice Nathan day then prn Follow up if no improvement - may need GI evaluation - dicyclomine (BENTYL) 10 MG capsule; Take 1 capsule (10 mg total) by mouth 4 (four) times daily as needed for spasms.  Dispense: 120 capsule; Refill: 1   Meds ordered this encounter  Medications  . dicyclomine (BENTYL) 10 MG capsule    Sig: Take 1 capsule (10 mg total) by mouth 4 (four) times daily as needed for spasms.    Dispense:  120 capsule    Refill:  1    Partially dictated using Animal nutritionistDragon software. Any errors are unintentional.  Nathan EdwardLaura Jesselee Poth, MD [facility]  04/20/2018

## 2018-09-21 ENCOUNTER — Encounter: Payer: Self-pay | Admitting: Internal Medicine

## 2018-09-21 ENCOUNTER — Ambulatory Visit (INDEPENDENT_AMBULATORY_CARE_PROVIDER_SITE_OTHER): Payer: BC Managed Care – PPO | Admitting: Internal Medicine

## 2018-09-21 VITALS — BP 120/82 | HR 86 | Temp 98.4°F | Ht 72.0 in | Wt 163.0 lb

## 2018-09-21 DIAGNOSIS — J0110 Acute frontal sinusitis, unspecified: Secondary | ICD-10-CM | POA: Diagnosis not present

## 2018-09-21 DIAGNOSIS — R05 Cough: Secondary | ICD-10-CM

## 2018-09-21 DIAGNOSIS — R059 Cough, unspecified: Secondary | ICD-10-CM

## 2018-09-21 MED ORDER — DOXYCYCLINE HYCLATE 100 MG PO TABS: 100.0000 mg | ORAL_TABLET | Freq: Two times a day (BID) | ORAL | 0 refills | Status: AC

## 2018-09-21 MED ORDER — ALBUTEROL SULFATE HFA 108 (90 BASE) MCG/ACT IN AERS: 2.0000 | INHALATION_SPRAY | Freq: Four times a day (QID) | RESPIRATORY_TRACT | 0 refills | Status: DC | PRN

## 2018-09-21 MED ORDER — GUAIFENESIN-CODEINE 100-10 MG/5ML PO SYRP: 5.0000 mL | ORAL_SOLUTION | Freq: Three times a day (TID) | ORAL | 0 refills | Status: DC | PRN

## 2018-09-21 MED ORDER — FLUTICASONE PROPIONATE 50 MCG/ACT NA SUSP: 2.0000 | Freq: Every day | NASAL | 0 refills | Status: DC

## 2018-09-21 NOTE — Patient Instructions (Signed)
Mucinex-DM twice a day  flonase nasal spray

## 2018-09-21 NOTE — Progress Notes (Signed)
Date:  09/21/2018   Name:  Nathan Guerrero [facility]   DOB:  Jul 30, 1986   MRN:  [id number]   Chief Complaint: Cough (X 2 days. Cough, congestion and sore throat. Body aches. Headaches. Chest pressure. Thick green production. Fatigue. )  Cough   URI   Associated symptoms include coughing.    Review of Systems  Respiratory: Positive for cough.     Patient Active Problem List   Diagnosis Date Noted  . Irritable bowel syndrome 04/20/2018  . Mouth ulcers 08/21/2016  . Rotator cuff syndrome of left shoulder 04/25/2015  . Underweight 04/25/2015    No Known Allergies  Past Surgical History:  Procedure Laterality Date  . WISDOM TOOTH EXTRACTION      Social History   Tobacco Use  . Smoking status: Never Smoker  . Smokeless tobacco: Never Used  Substance Use Topics  . Alcohol use: Yes    Alcohol/week: 0.0 standard drinks  . Drug use: No     Medication list has been reviewed and updated.  Current Meds  Medication Sig  . acetaminophen (TYLENOL) 325 MG tablet Take 650 mg by mouth every 6 (six) hours as needed.  . dicyclomine (BENTYL) 10 MG capsule Take 1 capsule (10 mg total) by mouth 4 (four) times daily as needed for spasms.  . Multiple Vitamin (MULTIVITAMIN) capsule Take 1 capsule by mouth daily.    PHQ 2/9 Scores 09/21/2018 09/16/2017 05/15/2017  PHQ - 2 Score 0 2 0  PHQ- 9 Score - 12 -    Physical Exam Constitutional:      Appearance: He is well-developed.  HENT:     Right Ear: External ear normal.     Left Ear: External ear normal. Tympanic membrane is not retracted.     Nose:     Right Sinus: Maxillary sinus tenderness and frontal sinus tenderness present.     Left Sinus: Maxillary sinus tenderness and frontal sinus tenderness present.     Mouth/Throat:     Mouth: No oral lesions.     Pharynx: Uvula midline. Posterior oropharyngeal erythema present. No oropharyngeal exudate.  Cardiovascular:     Rate and Rhythm: Normal rate and regular rhythm.     Heart  sounds: Normal heart sounds.  Pulmonary:     Breath sounds: Normal breath sounds. No decreased breath sounds, wheezing or rales.     Comments: Frequent dry cough noted Lymphadenopathy:     Cervical: No cervical adenopathy.  Neurological:     Mental Status: He is alert and oriented to person, place, and time.     BP 120/82   Pulse 86   Temp 98.4 F (36.9 C) (Oral)   Ht 6' (1.829 m)   Wt 163 lb (73.9 kg)   SpO2 98%   BMI 22.11 kg/m   Assessment and Plan: 1. Acute non-recurrent frontal sinusitis Start Flonase, continue fluids, tylenol - doxycycline (VIBRA-TABS) 100 MG tablet; Take 1 tablet (100 mg total) by mouth 2 (two) times daily for 10 days.  Dispense: 20 tablet; Refill: 0  2. Cough May be early bronchitis - will be covered by doxycycline - guaiFENesin-codeine (ROBITUSSIN AC) 100-10 MG/5ML syrup; Take 5 mLs by mouth 3 (three) times daily as needed for cough.  Dispense: 118 mL; Refill: 0 - albuterol (PROVENTIL HFA;VENTOLIN HFA) 108 (90 Base) MCG/ACT inhaler; Inhale 2 puffs into the lungs every 6 (six) hours as needed for wheezing or shortness of breath.  Dispense: 1 Inhaler; Refill: 0   Partially dictated using Dragon  software. Any errors are unintentional.  [name], MD [facility]  09/21/2018

## 2018-09-26 ENCOUNTER — Telehealth: Payer: Self-pay | Admitting: Family Medicine

## 2018-09-26 NOTE — Telephone Encounter (Signed)
Covering for [employer] - received call from nurse triage service on 09/25/18 approx 530pm regarding patient, Oneal DeputyJustin Macken, PCP is Dr Judithann GravesBerglund. Patient was last seen by her on 09/21/18 for sinusitis and cough, he was rx Doxycycline, Flonase, Albuterol, and Virtussin cough syrup. Call reported that patient requested refill of Virtussin codeine cough syrup, stated that it was only medicine to help him sleep.  Reviewed chart and declined to refill at this time. Based on amount of syrup that was rx, he should still have had enough to last at least 1 week if taking as prescribed, or less if only taking at night. I do not routinely rx controlled substances on phone and through the after hours answering service. Advised answering service that he may continue other medicines that were rx and add OTC cough medication in addition, if not improving or acute worsening could go to urgent care if need, or follow-up with PCP on Monday 09/27/18.  Forwarded to Dr Judithann GravesBerglund for review.  Saralyn PilarAlexander Serayah Yazdani, DO [facility] 09/26/2018, 7:05 PM

## 2018-09-27 ENCOUNTER — Other Ambulatory Visit: Payer: Self-pay | Admitting: Internal Medicine

## 2018-09-27 ENCOUNTER — Telehealth: Payer: Self-pay

## 2018-09-27 DIAGNOSIS — R05 Cough: Secondary | ICD-10-CM

## 2018-09-27 DIAGNOSIS — R059 Cough, unspecified: Secondary | ICD-10-CM

## 2018-09-27 MED ORDER — GUAIFENESIN-CODEINE 100-10 MG/5ML PO SYRP: 5.0000 mL | ORAL_SOLUTION | Freq: Three times a day (TID) | ORAL | 0 refills | Status: DC | PRN

## 2018-09-27 NOTE — Telephone Encounter (Signed)
Patient called leaving VM stating he is getting worse from last OV with being sick. Says he ran out of his "cough syrup Saturday". He was prescribed this last Tuesday at visit. Said he still feels like its all in his head with congestion and scratchy throat. Said he is taking tylenol, abx,  sudafed, cough syrup 3 times daily and some extra at night to help with sleep, and Flonase. Wanted to know what else he can take and also get a little more cough syrup to give him some relief at night.  Called patient and informed him he should not have ran out by Saturday. He should have had 7 days worth of medication. Informed Dr Judithann Graves recommended that he take sudafed with his other medications and told him she will send in only a little mor cough medication. Told him he only needs to be taking this at night.   He verbalized understanding of this.

## 2018-10-26 ENCOUNTER — Telehealth: Payer: Self-pay

## 2018-10-26 NOTE — Telephone Encounter (Signed)
Patient called speaking to front desk saying he was having SOB and chest pain for a week. Front desk told him he needs to go to the ER and patient REFUSED.  Told front desk to transfer pt to my desk.   Spoke with patient informing him that we do not do chest pain. Told him if he is having SOB and chest pain he needs to go to the hospital as this could be a heart condition or a possible clot in the lungs. Patient refused again stating " okay how about its not chest pain, and its just SOB with a cough and green mucous coming up."  Told the patient we really want him to be seen by ER. He still refused so I made him an appt for tomorrow morning.

## 2018-10-27 ENCOUNTER — Ambulatory Visit: Payer: Self-pay | Admitting: Internal Medicine

## 2018-11-08 ENCOUNTER — Ambulatory Visit (INDEPENDENT_AMBULATORY_CARE_PROVIDER_SITE_OTHER): Payer: BC Managed Care – PPO | Admitting: Internal Medicine

## 2018-11-08 ENCOUNTER — Other Ambulatory Visit: Payer: Self-pay

## 2018-11-08 ENCOUNTER — Encounter: Payer: Self-pay | Admitting: Internal Medicine

## 2018-11-08 ENCOUNTER — Ambulatory Visit
Admission: RE | Admit: 2018-11-08 | Discharge: 2018-11-08 | Disposition: A | Discharge status: Home / Self Care | Payer: BC Managed Care – PPO | Attending: Internal Medicine | Admitting: Internal Medicine

## 2018-11-08 ENCOUNTER — Ambulatory Visit
Admission: RE | Admit: 2018-11-08 | Discharge: 2018-11-08 | Disposition: A | Discharge status: Home / Self Care | Payer: BC Managed Care – PPO | Source: Ambulatory Visit | Attending: Internal Medicine | Admitting: Internal Medicine

## 2018-11-08 VITALS — BP 116/62 | HR 86 | Temp 98.2°F | Ht 72.0 in | Wt 161.0 lb

## 2018-11-08 DIAGNOSIS — J45901 Unspecified asthma with (acute) exacerbation: Secondary | ICD-10-CM | POA: Insufficient documentation

## 2018-11-08 DIAGNOSIS — H6123 Impacted cerumen, bilateral: Secondary | ICD-10-CM

## 2018-11-08 MED ORDER — AMOXICILLIN-POT CLAVULANATE 875-125 MG PO TABS: 1.0000 | ORAL_TABLET | Freq: Two times a day (BID) | ORAL | 0 refills | Status: AC

## 2018-11-08 NOTE — Progress Notes (Signed)
Date:  11/08/2018   Name:  Nathan Guerrero [name]   DOB:  Jan 07, 1986   MRN:  [id number]   Chief Complaint: Cough ( Finished abx. Have been using inhaler. Finished cough syrup X2. And Nasal spray. Was feeling much better . Throat is still sore and worse at night and in the AM. Wheezing. Coughing up green/ black phlem. )  Cough  This is a chronic problem. The current episode started more than 1 month ago. The problem has been gradually worsening. The problem occurs every few hours. The cough is productive of sputum (previously dark yellow but in the past few days much lighter). Associated symptoms include ear congestion and wheezing. Pertinent negatives include no chest pain, chills, ear pain, fever, headaches or shortness of breath. He has tried prescription cough suppressant and a beta-agonist inhaler (doxycycline) for the symptoms. The treatment provided moderate relief.    Review of Systems  Constitutional: Negative for chills, diaphoresis, fatigue and fever.  HENT: Negative for ear discharge, ear pain and hearing loss.   Eyes: Negative for visual disturbance.  Respiratory: Positive for cough and wheezing. Negative for chest tightness and shortness of breath.   Cardiovascular: Negative for chest pain and palpitations.  Neurological: Negative for dizziness and headaches.    Patient Active Problem List   Diagnosis Date Noted  . Irritable bowel syndrome 04/20/2018  . Mouth ulcers 08/21/2016  . Rotator cuff syndrome of left shoulder 04/25/2015  . Underweight 04/25/2015    No Known Allergies  Past Surgical History:  Procedure Laterality Date  . WISDOM TOOTH EXTRACTION      Social History   Tobacco Use  . Smoking status: Never Smoker  . Smokeless tobacco: Never Used  Substance Use Topics  . Alcohol use: Yes    Alcohol/week: 0.0 standard drinks  . Drug use: No     Medication list has been reviewed and updated.  No outpatient medications have been marked as taking for  the 11/08/18 encounter (Office Visit) with Reubin Milan, MD.    [facility] 2/9 Scores 11/08/2018 09/21/2018 09/16/2017 05/15/2017  PHQ - 2 Score 0 0 2 0  PHQ- 9 Score - - 12 -    Physical Exam Vitals signs and nursing note reviewed.  Constitutional:      General: He is not in acute distress.    Appearance: Normal appearance. He is well-developed.  HENT:     Head: Normocephalic and atraumatic.     Right Ear: There is impacted cerumen.     Left Ear: There is impacted cerumen.     Mouth/Throat:     Mouth: Mucous membranes are moist.  Eyes:     Conjunctiva/sclera: Conjunctivae normal.     Pupils: Pupils are equal, round, and reactive to light.  Neck:     Musculoskeletal: Normal range of motion and neck supple.  Cardiovascular:     Rate and Rhythm: Normal rate and regular rhythm.     Pulses: Normal pulses.     Heart sounds: Normal heart sounds.  Pulmonary:     Effort: Pulmonary effort is normal. No respiratory distress.     Breath sounds: Wheezing present. No rhonchi or rales.  Chest:     Chest wall: No tenderness.  Musculoskeletal: Normal range of motion.     Right lower leg: No edema.     Left lower leg: No edema.  Lymphadenopathy:     Cervical: No cervical adenopathy.  Skin:    General: Skin is warm and dry.  Findings: No rash.  Neurological:     Mental Status: He is alert and oriented to person, place, and time.  Psychiatric:        Behavior: Behavior normal.        Thought Content: Thought content normal.     BP 116/62   Pulse 86   Temp 98.2 F (36.8 C) (Oral)   Ht 6' (1.829 m)   Wt 161 lb (73 kg)   SpO2 94%   BMI 21.84 kg/m   Assessment and Plan: 1. Asthmatic bronchitis with acute exacerbation, unspecified asthma severity, unspecified whether persistent Suspect recent URI has triggered airway reactivity Sample of Symbicort 80/4.5 - 2 puffs twice a day Continue Albuterol MDI as needed Probiotic while on augmentin - DG Chest 2 View; Future -  amoxicillin-clavulanate (AUGMENTIN) 875-125 MG tablet; Take 1 tablet by mouth 2 (two) times daily for 10 days.  Dispense: 20 tablet; Refill: 0  2. Bilateral impacted cerumen  ENT for removal if desired   Partially dictated using Dragon software. Any errors are unintentional.  [name], MD [facility]  11/08/2018

## 2018-11-08 NOTE — Patient Instructions (Addendum)
Probiotic -take while taking the antibiotic.  Use Symbicort 2 puffs twice a day.. Rinse your mouth afterwards.

## 2018-11-10 ENCOUNTER — Telehealth: Payer: Self-pay

## 2018-11-10 NOTE — Telephone Encounter (Signed)
He has impacted cerumen in both ears that is likely contributing.  He should see ENT for cerumen removal and evaluation of the ear if needed.

## 2018-11-10 NOTE — Telephone Encounter (Signed)
Patient called leaving VM stating he has new sx that popped up yesterday. Said he now feels like his ears having popped and will not un-pop. Also, feels like he cannot swallow very well. There is no pain in his throat. Wants to let you know the new sx and see what advice he can get from you.  Please Advise.

## 2018-11-10 NOTE — Telephone Encounter (Signed)
Called and left VM about ENT. Awaiting call back about this.

## 2018-12-03 ENCOUNTER — Encounter: Payer: Self-pay | Admitting: Internal Medicine

## 2018-12-03 ENCOUNTER — Other Ambulatory Visit: Payer: Self-pay

## 2018-12-03 ENCOUNTER — Ambulatory Visit (INDEPENDENT_AMBULATORY_CARE_PROVIDER_SITE_OTHER): Payer: BC Managed Care – PPO | Admitting: Internal Medicine

## 2018-12-03 VITALS — Ht 72.0 in | Wt 161.0 lb

## 2018-12-03 DIAGNOSIS — L237 Allergic contact dermatitis due to plants, except food: Secondary | ICD-10-CM | POA: Diagnosis not present

## 2018-12-03 MED ORDER — PREDNISONE 10 MG PO TABS: ORAL_TABLET | ORAL | 0 refills | Status: DC

## 2018-12-03 NOTE — Progress Notes (Signed)
Date:  12/03/2018   Name:  Nathan Guerrero [facility]   DOB:  1986-09-02   MRN:  [id number]  I connected with this patient, Nathan Guerrero, by telephone at the patient's home . I verified that I am speaking with the correct person using two identifiers. This visit was conducted via telephone due to the Covid-19 outbreak from my office at [facility] in [place], [place]. I discussed the limitations, risks, security and privacy concerns of performing an evaluation and management service by telephone. I also discussed with the patient that there may be a patient responsible charge related to this service. The patient expressed understanding and agreed to proceed.  Chief Complaint: Rash (Wife had it Monday of this week and now he has it. He broke out on Tuesday night. Itchy no pain. )  Rash  This is a new problem. The current episode started in the past 7 days. The problem has been gradually worsening since onset. The affected locations include the right lower leg, left lower leg, left arm, face and right arm (spreading over multiple areas but no in eyes). The rash is characterized by blistering and itchiness. He was exposed to plant contact (wife has poison ivy.). Pertinent negatives include no cough, fatigue, fever or shortness of breath. Past treatments include anti-itch cream. The treatment provided no relief. (Extreme allergy to poison ivy)    Review of Systems  Constitutional: Negative for chills, fatigue and fever.  Eyes: Negative for redness and visual disturbance.  Respiratory: Negative for cough, chest tightness, shortness of breath and wheezing.   Musculoskeletal: Negative for arthralgias.  Skin: Positive for color change and rash.  Psychiatric/Behavioral: Positive for sleep disturbance (itching).    Patient Active Problem List   Diagnosis Date Noted  . Irritable bowel syndrome 04/20/2018  . Mouth ulcers 08/21/2016  . Rotator cuff syndrome of left shoulder 04/25/2015  .  Underweight 04/25/2015    No Known Allergies  Past Surgical History:  Procedure Laterality Date  . WISDOM TOOTH EXTRACTION      Social History   Tobacco Use  . Smoking status: Never Smoker  . Smokeless tobacco: Never Used  Substance Use Topics  . Alcohol use: Yes    Alcohol/week: 0.0 standard drinks  . Drug use: No     Medication list has been reviewed and updated.  Current Meds  Medication Sig  . acetaminophen (TYLENOL) 325 MG tablet Take 650 mg by mouth every 6 (six) hours as needed.  Marland Kitchen albuterol (PROVENTIL HFA;VENTOLIN HFA) 108 (90 Base) MCG/ACT inhaler Inhale 2 puffs into the lungs every 6 (six) hours as needed for wheezing or shortness of breath.  . dicyclomine (BENTYL) 10 MG capsule Take 1 capsule (10 mg total) by mouth 4 (four) times daily as needed for spasms.  . Multiple Vitamin (MULTIVITAMIN) capsule Take 1 capsule by mouth daily.    PHQ 2/9 Scores 11/08/2018 09/21/2018 09/16/2017 05/15/2017  PHQ - 2 Score 0 0 2 0  PHQ- 9 Score - - 12 -    BP Readings from Last 3 Encounters:  11/08/18 116/62  09/21/18 120/82  04/20/18 104/62    Physical Exam  No exam done due to telephone encounter.  Patient alert, oriented and does sound like he is in any distress.  Wt Readings from Last 3 Encounters:  12/03/18 161 lb (73 kg)  11/08/18 161 lb (73 kg)  09/21/18 163 lb (73.9 kg)    Ht 6' (1.829 m)   Wt 161 lb (73 kg)  BMI 21.84 kg/m   Assessment and Plan: 1. Allergic contact dermatitis due to plants, except food Continue Calamine lotion, topical cortisone Follow up if needed - predniSONE (DELTASONE) 10 MG tablet; Take 6 on day 1and 2, 5 on day 3 and 4, 4 on day 5 and 6 , 3 on day 7 and 8, 2 on day 9 and 10 and 1 on day 11 and 12 then stop.  Dispense: 42 tablet; Refill: 0  I spent 8 minutes on this encounter.  Partially dictated using Animal nutritionist. Any errors are unintentional.  [name], MD [facility]  12/03/2018

## 2018-12-06 ENCOUNTER — Ambulatory Visit: Payer: Self-pay | Admitting: Internal Medicine

## 2018-12-15 ENCOUNTER — Telehealth: Payer: Self-pay

## 2018-12-15 ENCOUNTER — Other Ambulatory Visit: Payer: Self-pay | Admitting: Internal Medicine

## 2018-12-15 DIAGNOSIS — L237 Allergic contact dermatitis due to plants, except food: Secondary | ICD-10-CM

## 2018-12-15 MED ORDER — PREDNISONE 10 MG PO TABS: ORAL_TABLET | ORAL | 0 refills | Status: AC

## 2018-12-15 NOTE — Telephone Encounter (Signed)
Patient finished treatment for Poison Ivy yesterday and now has some again on Right arm and Right Hip that started last night. Would like to get ahead of this now rather than wait days again. Can we send in another steroid round? Patient feels it helped good and will check with pharmacy unless I call with different plan.

## 2018-12-15 NOTE — Telephone Encounter (Signed)
Refill sent to [facility].

## 2018-12-31 ENCOUNTER — Other Ambulatory Visit: Payer: Self-pay

## 2018-12-31 ENCOUNTER — Encounter: Payer: Self-pay | Admitting: Internal Medicine

## 2018-12-31 ENCOUNTER — Telehealth: Payer: Self-pay

## 2018-12-31 ENCOUNTER — Ambulatory Visit (INDEPENDENT_AMBULATORY_CARE_PROVIDER_SITE_OTHER): Payer: BC Managed Care – PPO | Admitting: Internal Medicine

## 2018-12-31 VITALS — BP 112/64 | HR 87 | Temp 98.4°F | Ht 72.0 in | Wt 161.0 lb

## 2018-12-31 DIAGNOSIS — L246 Irritant contact dermatitis due to food in contact with skin: Secondary | ICD-10-CM | POA: Diagnosis not present

## 2018-12-31 DIAGNOSIS — B354 Tinea corporis: Secondary | ICD-10-CM | POA: Diagnosis not present

## 2018-12-31 MED ORDER — FLUCONAZOLE 100 MG PO TABS: ORAL_TABLET | ORAL | 0 refills | Status: DC

## 2018-12-31 MED ORDER — KETOCONAZOLE 2 % EX CREA: 1.0000 "application " | TOPICAL_CREAM | Freq: Two times a day (BID) | CUTANEOUS | 0 refills | Status: DC

## 2018-12-31 NOTE — Telephone Encounter (Signed)
Patient called saying he finished his 2nd steroid treatment. Now has rash that is spread on both arms, both thighs, both knees, and stomach. This started on his stomach.   At first he thought I was a heat rash, but it is now getting worse. [place] Spaniel sort of looks like pimples with nothing inside/underneath them. Said they are blotchy clusters.  Unsure of what to do at this point, and wants advise. Does he need to come back in?  - Please advise.

## 2018-12-31 NOTE — Progress Notes (Signed)
Date:  12/31/2018   Name:  Nathan Guerrero   DOB:  [birth year]   MRN:  [id number]   Chief Complaint: Rash (No pain but itching. Spread now to different spots. ) Pt had telephone visit last month for contact dermatitis due to poison ivy.  He was given a steroid taper.  He call back saying that he needed additional medication so a longer taper was prescribed.  He has just finished that up.  Now he has a rash that seems to be different. Rash  This is a new problem. The current episode started in the past 7 days. The problem is unchanged. Location: in crease of elbow, behind knees. The rash is characterized by redness and itchiness (much less itchy than poison ivy). He was exposed to nothing (recently treated with prednisone). Pertinent negatives include no cough, fatigue, fever or shortness of breath. Past treatments include nothing.    Review of Systems  Constitutional: Negative for chills, fatigue and fever.  HENT: Negative for mouth sores and trouble swallowing.   Respiratory: Negative for cough, chest tightness and shortness of breath.   Cardiovascular: Negative for chest pain, palpitations and leg swelling.  Gastrointestinal: Negative for abdominal pain, blood in stool and constipation.  Skin: Positive for color change and rash.  Neurological: Negative for dizziness and headaches.  Hematological: Negative for adenopathy.  Psychiatric/Behavioral: Negative for sleep disturbance.    Patient Active Problem List   Diagnosis Date Noted  . Irritable bowel syndrome 04/20/2018  . Mouth ulcers 08/21/2016  . Rotator cuff syndrome of left shoulder 04/25/2015  . Underweight 04/25/2015    No Known Allergies  Past Surgical History:  Procedure Laterality Date  . WISDOM TOOTH EXTRACTION      Social History   Tobacco Use  . Smoking status: Never Smoker  . Smokeless tobacco: Never Used  Substance Use Topics  . Alcohol use: Yes    Alcohol/week: 0.0 standard drinks  . Drug use:  No     Medication list has been reviewed and updated.  Current Meds  Medication Sig  . acetaminophen (TYLENOL) 325 MG tablet Take 650 mg by mouth every 6 (six) hours as needed.  Marland Kitchen. albuterol (PROVENTIL HFA;VENTOLIN HFA) 108 (90 Base) MCG/ACT inhaler Inhale 2 puffs into the lungs every 6 (six) hours as needed for wheezing or shortness of breath.  . dicyclomine (BENTYL) 10 MG capsule Take 1 capsule (10 mg total) by mouth 4 (four) times daily as needed for spasms.  . Multiple Vitamin (MULTIVITAMIN) capsule Take 1 capsule by mouth daily.    PHQ 2/9 Scores 12/31/2018 11/08/2018 09/21/2018 09/16/2017  PHQ - 2 Score 0 0 0 2  PHQ- 9 Score - - - 12    BP Readings from Last 3 Encounters:  12/31/18 112/64  11/08/18 116/62  09/21/18 120/82    Physical Exam Vitals signs and nursing note reviewed.  Constitutional:      General: He is not in acute distress.    Appearance: He is well-developed.  HENT:     Head: Normocephalic and atraumatic.     Mouth/Throat:     Mouth: Mucous membranes are moist.  Neck:     Musculoskeletal: Normal range of motion and neck supple.  Pulmonary:     Effort: Pulmonary effort is normal. No respiratory distress.  Abdominal:     General: Abdomen is flat.  Musculoskeletal: Normal range of motion.  Lymphadenopathy:     Cervical: No cervical adenopathy.  Skin:    General: Skin  is warm and dry.     Findings: No rash.     Comments: Flat erythematous patches in antecubital areas L>R Several similar lesions on lower legs and behind both knees Healing areas of vesicle from previous poison ivy on arms and legs Tiny punctate blanching lesions on abdomen and upper legs.   Neurological:     Mental Status: He is alert and oriented to person, place, and time.  Psychiatric:        Behavior: Behavior normal.        Thought Content: Thought content normal.     Wt Readings from Last 3 Encounters:  12/31/18 161 lb (73 kg)  12/03/18 161 lb (73 kg)  11/08/18 161 lb (73 kg)     BP 112/64   Pulse 87   Temp 98.4 F (36.9 C) (Oral)   Ht 6' (1.829 m)   Wt 161 lb (73 kg)   SpO2 96%   BMI 21.84 kg/m   Assessment and Plan: 1. Tinea corporis - fluconazole (DIFLUCAN) 100 MG tablet; Take one every other day for 3 doses  Dispense: 3 tablet; Refill: 0 - ketoconazole (NIZORAL) 2 % cream; Apply 1 application topically 2 (two) times daily.  Dispense: 60 g; Refill: 0  2. Irritant contact dermatitis due to food in contact with skin No further prednisone since sx are improving   Partially dictated using Animal nutritionist. Any errors are unintentional.  [name], MD [facility]  12/31/2018

## 2019-03-21 ENCOUNTER — Other Ambulatory Visit: Payer: Self-pay | Admitting: Internal Medicine

## 2019-03-21 ENCOUNTER — Telehealth: Payer: Self-pay

## 2019-03-21 DIAGNOSIS — B354 Tinea corporis: Secondary | ICD-10-CM

## 2019-03-21 MED ORDER — FLUCONAZOLE 100 MG PO TABS: 100.0000 mg | ORAL_TABLET | Freq: Every day | ORAL | 0 refills | Status: AC

## 2019-03-21 NOTE — Telephone Encounter (Signed)
ERROR- opened two telephone calls on accident.

## 2019-03-21 NOTE — Telephone Encounter (Signed)
Rash is back. Single individual spots starting to dry up with yellow crusts.  Wants diflucan sent to try again. Still has some cream left from last rash visit.  Dr. Army Melia sent in diflucan and patient informed to take 1 tablet every other day for 3 days.

## 2019-09-13 ENCOUNTER — Ambulatory Visit (INDEPENDENT_AMBULATORY_CARE_PROVIDER_SITE_OTHER): Payer: BC Managed Care – PPO | Admitting: Internal Medicine

## 2019-09-13 ENCOUNTER — Other Ambulatory Visit: Payer: Self-pay

## 2019-09-13 ENCOUNTER — Encounter: Payer: Self-pay | Admitting: Internal Medicine

## 2019-09-13 VITALS — Temp 98.1°F | Ht 72.0 in | Wt 161.0 lb

## 2019-09-13 DIAGNOSIS — J029 Acute pharyngitis, unspecified: Secondary | ICD-10-CM

## 2019-09-13 MED ORDER — AMOXICILLIN-POT CLAVULANATE 250-62.5 MG/5ML PO SUSR: 500.0000 mg | Freq: Three times a day (TID) | ORAL | 0 refills | Status: AC

## 2019-09-13 NOTE — Progress Notes (Signed)
Date:  09/13/2019   Name:  Nathan Guerrero [facility]   DOB:  1986-01-04   MRN:  [id number]  I connected with this patient, Nathan Guerrero, by telephone at the patient's home.  I verified that I am speaking with the correct person using two identifiers. This visit was conducted via telephone due to the Covid-19 outbreak from my office at [facility] in [place], [place]. I discussed the limitations, risks, security and privacy concerns of performing an evaluation and management service by telephone. I also discussed with the patient that there may be a patient responsible charge related to this service. The patient expressed understanding and agreed to proceed.  Chief Complaint: Sore Throat (Painful sore throat, difficulty swallowing. Started Sunday night. Cannot eat or swallow. Feels like swallowed "razor blade." Very red throat. No fever. Ears painful when sneezing ( pressure. ) Lost 5 lbs in last few days because he cannot eat or swallow. 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 No loss of taste or smell. No SOB.)  Sore Throat  This is a new problem. The current episode started in the past 7 days. The problem has been gradually worsening. There has been no fever. Associated symptoms include trouble swallowing. Pertinent negatives include no abdominal pain, congestion, coughing, diarrhea, ear discharge, headaches, hoarse voice, shortness of breath, swollen glands or vomiting.    Lab Results  Component Value Date   CREATININE 0.96 09/16/2017   BUN 22 (H) 09/16/2017   NA 138 09/16/2017   K 4.8 09/16/2017   CL 101 09/16/2017   CO2 23 09/16/2017   No results found for: CHOL, HDL, LDLCALC, LDLDIRECT, TRIG, CHOLHDL Lab Results  Component Value Date   TSH 1.180 09/16/2017   No results found for: HGBA1C   Review of Systems  Constitutional:  Positive for unexpected weight change. Negative for chills, fatigue and fever.  HENT: Positive for sore throat and trouble swallowing. Negative for congestion, ear discharge and hoarse voice.   Respiratory: Negative for cough, shortness of breath and wheezing.   Cardiovascular: Negative for chest pain and palpitations.  Gastrointestinal: Negative for abdominal pain, diarrhea, nausea and vomiting.  Genitourinary: Positive for decreased urine volume. Negative for difficulty urinating.  Musculoskeletal: Negative for arthralgias and myalgias.  Neurological: Positive for light-headedness. Negative for dizziness and headaches.  Psychiatric/Behavioral: Positive for sleep disturbance.    Patient Active Problem List   Diagnosis Date Noted  . Irritable bowel syndrome 04/20/2018  . Mouth ulcers 08/21/2016  . Rotator cuff syndrome of left shoulder 04/25/2015  . Underweight 04/25/2015    No Known Allergies  Past Surgical History:  Procedure Laterality Date  . WISDOM TOOTH EXTRACTION      Social History   Tobacco Use  . Smoking status: Never Smoker  . Smokeless tobacco: Never Used  Substance Use Topics  . Alcohol use: Yes    Alcohol/week: 0.0 standard drinks  . Drug use: No     Medication list has been reviewed and updated.  Current Meds  Medication Sig  . acetaminophen (TYLENOL) 325 MG tablet Take 650 mg by mouth every 6 (six) hours as needed.  Marland Kitchen albuterol (PROVENTIL HFA;VENTOLIN HFA) 108 (90 Base) MCG/ACT inhaler Inhale 2 puffs into the lungs every 6 (six) hours as needed for wheezing or shortness of breath.  . Multiple Vitamin (  MULTIVITAMIN) capsule Take 1 capsule by mouth daily.    PHQ 2/9 Scores 09/13/2019 12/31/2018 11/08/2018 09/21/2018  PHQ - 2 Score 0 0 0 0  PHQ- 9 Score - - - -    BP Readings from Last 3 Encounters:  12/31/18 112/64  11/08/18 116/62  09/21/18 120/82    Physical Exam Constitutional:      Comments: Sounds fatigued  Pulmonary:     Effort: Pulmonary  effort is normal.     Breath sounds: Normal breath sounds.  Neurological:     Mental Status: He is alert.  Psychiatric:        Attention and Perception: Attention normal.        Mood and Affect: Mood normal.     Wt Readings from Last 3 Encounters:  09/13/19 161 lb (73 kg)  12/31/18 161 lb (73 kg)  12/03/18 161 lb (73 kg)    Temp 98.1 F (36.7 C) (Oral)   Ht 6' (1.829 m)   Wt 161 lb (73 kg)   BMI 21.84 kg/m   Assessment and Plan: 1. Pharyngitis, unspecified etiology Suspect bacterial etiology Liquid or chewable Advil for pain Increase fluids Use throat sprays or drops as needed - amoxicillin-clavulanate (AUGMENTIN) 250-62.5 MG/5ML suspension; Take 10 mLs (500 mg total) by mouth 3 (three) times daily for 10 days.  Dispense: 300 mL; Refill: 0   Partially dictated using Editor, commissioning. Any errors are unintentional.  Halina Maidens, MD [facility]  09/13/2019

## 2019-09-14 ENCOUNTER — Telehealth: Payer: Self-pay | Admitting: Internal Medicine

## 2019-09-14 NOTE — Telephone Encounter (Signed)
Patient Nathan Guerrero on chassidy Voice mail this morning, he called back and thinks he's needs to be tested for covid , his son is out of daycare on quarantine and is positive for covid, he also mention that his sore throat has worsened and is having a hard time swallowing the antibiotic dr berglund prescribed to him. He says he's has a fever, diarrhea, vomiting,

## 2019-09-14 NOTE — Telephone Encounter (Signed)
Called patient and gave him the number to text Covid for scheduling an appt to be tested. Pt confirmed understanding and said he wrote the info down.  Lovett Calender, CMA

## 2019-09-15 ENCOUNTER — Emergency Department
Admission: EM | Admit: 2019-09-15 | Discharge: 2019-09-15 | Disposition: A | Discharge status: Home / Self Care | Payer: BC Managed Care – PPO | Attending: Student in an Organized Health Care Education/Training Program | Admitting: Student in an Organized Health Care Education/Training Program

## 2019-09-15 ENCOUNTER — Telehealth: Payer: Self-pay

## 2019-09-15 ENCOUNTER — Ambulatory Visit: Payer: BC Managed Care – PPO | Attending: Internal Medicine

## 2019-09-15 ENCOUNTER — Emergency Department: Payer: BC Managed Care – PPO

## 2019-09-15 ENCOUNTER — Encounter: Payer: Self-pay | Admitting: Emergency Medicine

## 2019-09-15 ENCOUNTER — Other Ambulatory Visit: Payer: Self-pay

## 2019-09-15 DIAGNOSIS — Z79899 Other long term (current) drug therapy: Secondary | ICD-10-CM | POA: Diagnosis not present

## 2019-09-15 DIAGNOSIS — R112 Nausea with vomiting, unspecified: Secondary | ICD-10-CM | POA: Diagnosis present

## 2019-09-15 DIAGNOSIS — J069 Acute upper respiratory infection, unspecified: Secondary | ICD-10-CM | POA: Diagnosis not present

## 2019-09-15 DIAGNOSIS — Z20822 Contact with and (suspected) exposure to covid-19: Secondary | ICD-10-CM

## 2019-09-15 LAB — LIPASE, BLOOD: Lipase: 23 U/L (ref 11–51)

## 2019-09-15 LAB — URINALYSIS, COMPLETE (UACMP) WITH MICROSCOPIC
Bilirubin Urine: NEGATIVE
Glucose, UA: NEGATIVE mg/dL
Hgb urine dipstick: NEGATIVE
Ketones, ur: 80 mg/dL — AB
Leukocytes,Ua: NEGATIVE
Nitrite: NEGATIVE
Protein, ur: NEGATIVE mg/dL
Specific Gravity, Urine: 1.025 (ref 1.005–1.030)
Squamous Epithelial / HPF: NONE SEEN (ref 0–5)
pH: 5 (ref 5.0–8.0)

## 2019-09-15 LAB — COMPREHENSIVE METABOLIC PANEL
ALT: 15 U/L (ref 0–44)
AST: 17 U/L (ref 15–41)
Albumin: 4.6 g/dL (ref 3.5–5.0)
Alkaline Phosphatase: 81 U/L (ref 38–126)
Anion gap: 16 — ABNORMAL HIGH (ref 5–15)
BUN: 21 mg/dL — ABNORMAL HIGH (ref 6–20)
CO2: 21 mmol/L — ABNORMAL LOW (ref 22–32)
Calcium: 9.4 mg/dL (ref 8.9–10.3)
Chloride: 101 mmol/L (ref 98–111)
Creatinine, Ser: 0.96 mg/dL (ref 0.61–1.24)
GFR calc Af Amer: >60 mL/min (ref >60)
GFR calc non Af Amer: >60 mL/min (ref >60)
Glucose, Bld: 100 mg/dL — ABNORMAL HIGH (ref 70–99)
Potassium: 4.2 mmol/L (ref 3.5–5.1)
Sodium: 138 mmol/L (ref 135–145)
Total Bilirubin: 1.6 mg/dL — ABNORMAL HIGH (ref 0.3–1.2)
Total Protein: 8.4 g/dL — ABNORMAL HIGH (ref 6.5–8.1)

## 2019-09-15 LAB — CBC
HCT: 47.9 % (ref 39.0–52.0)
Hemoglobin: 16.9 g/dL (ref 13.0–17.0)
MCH: 29.5 pg (ref 26.0–34.0)
MCHC: 35.3 g/dL (ref 30.0–36.0)
MCV: 83.6 fL (ref 80.0–100.0)
Platelets: 300 10*3/uL (ref 150–400)
RBC: 5.73 MIL/uL (ref 4.22–5.81)
RDW: 12.1 % (ref 11.5–15.5)
WBC: 12 10*3/uL — ABNORMAL HIGH (ref 4.0–10.5)
nRBC: 0 % (ref 0.0–0.2)

## 2019-09-15 LAB — POC SARS CORONAVIRUS 2 AG: SARS Coronavirus 2 Ag: NEGATIVE

## 2019-09-15 MED ORDER — SODIUM CHLORIDE 0.9 % IV BOLUS
1000.0000 mL | Freq: Once | INTRAVENOUS | Status: AC
  Administered 2019-09-15: 1000 mL via INTRAVENOUS

## 2019-09-15 MED ORDER — ONDANSETRON HCL 4 MG/2ML IJ SOLN
4.0000 mg | Freq: Once | INTRAMUSCULAR | Status: DC
  Filled 2019-09-15: qty 2

## 2019-09-15 MED ORDER — KETOROLAC TROMETHAMINE 30 MG/ML IJ SOLN
15.0000 mg | Freq: Once | INTRAMUSCULAR | Status: AC
  Administered 2019-09-15: 15 mg via INTRAVENOUS
  Filled 2019-09-15: qty 1

## 2019-09-15 MED ORDER — ONDANSETRON HCL 4 MG/2ML IJ SOLN
4.0000 mg | Freq: Once | INTRAMUSCULAR | Status: AC
  Administered 2019-09-15: 4 mg via INTRAVENOUS
  Filled 2019-09-15: qty 2

## 2019-09-15 MED ORDER — KETOROLAC TROMETHAMINE 30 MG/ML IJ SOLN
15.0000 mg | Freq: Once | INTRAMUSCULAR | Status: DC
  Filled 2019-09-15: qty 1

## 2019-09-15 MED ORDER — DEXAMETHASONE SODIUM PHOSPHATE 10 MG/ML IJ SOLN
10.0000 mg | Freq: Once | INTRAMUSCULAR | Status: DC
  Filled 2019-09-15: qty 1

## 2019-09-15 MED ORDER — SODIUM CHLORIDE 0.9% FLUSH
3.0000 mL | Freq: Once | INTRAVENOUS | Status: AC
  Administered 2019-09-15: 3 mL via INTRAVENOUS

## 2019-09-15 MED ORDER — ONDANSETRON 4 MG PO TBDP: 4.0000 mg | ORAL_TABLET | Freq: Three times a day (TID) | ORAL | 0 refills | Status: DC | PRN

## 2019-09-15 MED ORDER — LIDOCAINE VISCOUS HCL 2 % MT SOLN
15.0000 mL | Freq: Once | OROMUCOSAL | Status: AC
  Administered 2019-09-15: 15 mL via OROMUCOSAL
  Filled 2019-09-15: qty 15

## 2019-09-15 MED ORDER — DEXAMETHASONE SODIUM PHOSPHATE 10 MG/ML IJ SOLN
10.0000 mg | Freq: Once | INTRAMUSCULAR | Status: AC
  Administered 2019-09-15: 10 mg via INTRAVENOUS
  Filled 2019-09-15: qty 1

## 2019-09-15 MED ORDER — PROMETHAZINE HCL 12.5 MG PO TABS: 12.5000 mg | ORAL_TABLET | Freq: Four times a day (QID) | ORAL | 0 refills | Status: DC | PRN

## 2019-09-15 NOTE — ED Provider Notes (Signed)
[facility] Emergency Department Provider Note    First MD Initiated Contact with Patient 09/15/19 1515     (approximate)  I have reviewed the triage vital signs and the nursing notes.   HISTORY  Chief Complaint Emesis, Sore Throat, and Fever    HPI Nathan Guerrero is a 34 y.o. male   below listed past medical history presents for evaluation of congestion sore throat nausea vomiting and diarrhea.  Has been having low-grade temperatures.  Was concerned that he had Covid and had outpatient testing yesterday.  Was also put on antibiotic and symptomatic management for presumed strep throat.  States he came to the ER because he feels like he is losing weight and feels significantly dehydrated and that he needs fluids.  Denies any abdominal pain.  Has only had 1 or 2 episodes of loose stool.  No bloody diarrhea.   Past Medical History:  Diagnosis Date  . IBS (irritable bowel syndrome)    No family history on file. Past Surgical History:  Procedure Laterality Date  . WISDOM TOOTH EXTRACTION     Patient Active Problem List   Diagnosis Date Noted  . Irritable bowel syndrome 04/20/2018  . Mouth ulcers 08/21/2016  . Rotator cuff syndrome of left shoulder 04/25/2015  . Underweight 04/25/2015      Prior to Admission medications   Medication Sig Start Date End Date Taking? Authorizing Provider  acetaminophen (TYLENOL) 325 MG tablet Take 650 mg by mouth every 6 (six) hours as needed.    [provider]  albuterol (PROVENTIL HFA;VENTOLIN HFA) 108 (90 Base) MCG/ACT inhaler Inhale 2 puffs into the lungs every 6 (six) hours as needed for wheezing or shortness of breath. 09/21/18   Reubin Milan, MD  amoxicillin-clavulanate (AUGMENTIN) 250-62.5 MG/5ML suspension Take 10 mLs (500 mg total) by mouth 3 (three) times daily for 10 days. 09/13/19 09/23/19  Reubin Milan, MD  dicyclomine (BENTYL) 10 MG capsule Take 1 capsule (10 mg total) by mouth 4  (four) times daily as needed for spasms. Patient not taking: Reported on 09/13/2019 04/20/18   Reubin Milan, MD  ketoconazole (NIZORAL) 2 % cream Apply 1 application topically 2 (two) times daily. Patient not taking: Reported on 09/13/2019 12/31/18   Reubin Milan, MD  Multiple Vitamin (MULTIVITAMIN) capsule Take 1 capsule by mouth daily.    [provider]  ondansetron (ZOFRAN ODT) 4 MG disintegrating tablet Take 1 tablet (4 mg total) by mouth every 8 (eight) hours as needed for nausea or vomiting. 09/15/19   Willy Eddy, MD  promethazine (PHENERGAN) 12.5 MG tablet Take 1 tablet (12.5 mg total) by mouth every 6 (six) hours as needed. 09/15/19   Willy Eddy, MD    Allergies Patient has no known allergies.    Social History Social History   Tobacco Use  . Smoking status: Never Smoker  . Smokeless tobacco: Never Used  Substance Use Topics  . Alcohol use: Yes    Alcohol/week: 0.0 standard drinks  . Drug use: No    Review of Systems Patient denies headaches, rhinorrhea, blurry vision, numbness, shortness of breath, chest pain, edema, cough, abdominal pain, nausea, vomiting, diarrhea, dysuria, fevers, rashes or hallucinations unless otherwise stated above in HPI. ____________________________________________   PHYSICAL EXAM:  VITAL SIGNS: Vitals:   09/15/19 1441 09/15/19 1555  BP: 132/79   Pulse: (!) 118 84  Resp: 16 (!) 22  Temp: 100 F (37.8 C)   SpO2: 97%  Constitutional: Alert and oriented.  Eyes: Conjunctivae are normal.  Head: Atraumatic. Nose: No congestion/rhinnorhea. Mouth/Throat: Mucous membranes are moist.  Pharyngeal erythema without exudates, no mass or edema.  Uvula midline.  Normal phonation Neck: No stridor. Painless ROM.  Cardiovascular: Normal rate, regular rhythm. Grossly normal heart sounds.  Good peripheral circulation. Respiratory: Normal respiratory effort.  No retractions. Lungs CTAB. Gastrointestinal: Soft and nontender. No  distention. No abdominal bruits. No CVA tenderness. Genitourinary:  Musculoskeletal: No lower extremity tenderness nor edema.  No joint effusions. Neurologic:  Normal speech and language. No gross focal neurologic deficits are appreciated. No facial droop Skin:  Skin is warm, dry and intact. No rash noted. Psychiatric: Mood and affect are normal. Speech and behavior are normal.  ____________________________________________   LABS (all labs ordered are listed, but only abnormal results are displayed)  Results for orders placed or performed during the hospital encounter of 09/15/19 (from the past 24 hour(s))  Lipase, blood     Status: None   Collection Time: 09/15/19  2:46 PM  Result Value Ref Range   Lipase 23 11 - 51 U/L  Comprehensive metabolic panel     Status: Abnormal   Collection Time: 09/15/19  2:46 PM  Result Value Ref Range   Sodium 138 135 - 145 mmol/L   Potassium 4.2 3.5 - 5.1 mmol/L   Chloride 101 98 - 111 mmol/L   CO2 21 (L) 22 - 32 mmol/L   Glucose, Bld 100 (H) 70 - 99 mg/dL   BUN 21 (H) 6 - 20 mg/dL   Creatinine, Ser 0.96 0.61 - 1.24 mg/dL   Calcium 9.4 8.9 - 10.3 mg/dL   Total Protein 8.4 (H) 6.5 - 8.1 g/dL   Albumin 4.6 3.5 - 5.0 g/dL   AST 17 15 - 41 U/L   ALT 15 0 - 44 U/L   Alkaline Phosphatase 81 38 - 126 U/L   Total Bilirubin 1.6 (H) 0.3 - 1.2 mg/dL   GFR calc non Af Amer >60 >60 mL/min   GFR calc Af Amer >60 >60 mL/min   Anion gap 16 (H) 5 - 15  CBC     Status: Abnormal   Collection Time: 09/15/19  2:46 PM  Result Value Ref Range   WBC 12.0 (H) 4.0 - 10.5 K/uL   RBC 5.73 4.22 - 5.81 MIL/uL   Hemoglobin 16.9 13.0 - 17.0 g/dL   HCT 47.9 39.0 - 52.0 %   MCV 83.6 80.0 - 100.0 fL   MCH 29.5 26.0 - 34.0 pg   MCHC 35.3 30.0 - 36.0 g/dL   RDW 12.1 11.5 - 15.5 %   Platelets 300 150 - 400 K/uL   nRBC 0.0 0.0 - 0.2 %  POC SARS Coronavirus 2 Ag     Status: None   Collection Time: 09/15/19  4:14 PM  Result Value Ref Range   SARS Coronavirus 2 Ag NEGATIVE  NEGATIVE  Urinalysis, Complete w Microscopic     Status: Abnormal   Collection Time: 09/15/19  4:53 PM  Result Value Ref Range   Color, Urine YELLOW (A) YELLOW   APPearance CLEAR (A) CLEAR   Specific Gravity, Urine 1.025 1.005 - 1.030   pH 5.0 5.0 - 8.0   Glucose, UA NEGATIVE NEGATIVE mg/dL   Hgb urine dipstick NEGATIVE NEGATIVE   Bilirubin Urine NEGATIVE NEGATIVE   Ketones, ur 80 (A) NEGATIVE mg/dL   Protein, ur NEGATIVE NEGATIVE mg/dL   Nitrite NEGATIVE NEGATIVE   Leukocytes,Ua NEGATIVE NEGATIVE  WBC, UA 0-5 0 - 5 WBC/hpf   Bacteria, UA RARE (A) NONE SEEN   Squamous Epithelial / LPF NONE SEEN 0 - 5   Mucus PRESENT    ____________________________________________  EKG____________________________________________  RADIOLOGY  I personally reviewed all radiographic images ordered to evaluate for the above acute complaints and reviewed radiology reports and findings.  These findings were personally discussed with the patient.  Please see medical record for radiology report.  ____________________________________________   PROCEDURES  Procedure(s) performed:  Procedures    Critical Care performed: no ____________________________________________   INITIAL IMPRESSION / ASSESSMENT AND PLAN / ED COURSE  Pertinent labs & imaging results that were available during my care of the patient were reviewed by me and considered in my medical decision making (see chart for details).   DDX: URI, strep, PTA, flu, Covid, enteritis, gastritis, cholecystitis, appendicitis, SBO, C. difficile  Nathan Guerrero is a 34 y.o. who presents to the ED with symptoms as described above.  Patient nontoxic-appearing speaking in complete sentences.  Mildly tachycardic and with low-grade temperature.  Symptoms consistent with URI and have a very high suspicion this is Covid related therefore will send off antigen testing will provide IV fluids.  Chest x-ray without any evidence of pneumonia.  His  abdominal exam is soft and benign.  The patient will be placed on continuous pulse oximetry and telemetry for monitoring.  Laboratory evaluation will be sent to evaluate for the above complaints.     Clinical Course as of Sep 15 1827  Morey Hummingbird Sep 15, 2019  1639 Rapid Covid negative.  Has Covid test pending send out.  Repeat abdominal exam is soft and benign.  Feels improved with fluids.  Will give additional IV hydration.  Do want to check a urine.  I will lower suspicion for intra-abdominal pathology based on benign exam.   [PR]  1707 Feeling improved after ivf.  Requesting something to eat.   [PR]  1829 Patient clinically significantly improved.  At this point do believe he is stable and appropriate for outpatient follow-up.  We discussed signs and symptoms for which he should return.   [PR]    Clinical Course User Index [PR] Willy Eddy, MD    The patient was evaluated in Emergency Department today for the symptoms described in the history of present illness. He/she was evaluated in the context of the global COVID-19 pandemic, which necessitated consideration that the patient might be at risk for infection with the SARS-CoV-2 virus that causes COVID-19. Institutional protocols and algorithms that pertain to the evaluation of patients at risk for COVID-19 are in a state of rapid change based on information released by regulatory bodies including the CDC and federal and state organizations. These policies and algorithms were followed during the patient's care in the ED.  As part of my medical decision making, I reviewed the following data within the electronic MEDICAL RECORD NUMBER Nursing notes reviewed and incorporated, Labs reviewed, notes from prior ED visits and [place] Controlled Substance Database   ____________________________________________   FINAL CLINICAL IMPRESSION(S) / ED DIAGNOSES  Final diagnoses:  Upper respiratory tract infection, unspecified type  Nausea vomiting and diarrhea        NEW MEDICATIONS STARTED DURING THIS VISIT:  New Prescriptions   ONDANSETRON (ZOFRAN ODT) 4 MG DISINTEGRATING TABLET    Take 1 tablet (4 mg total) by mouth every 8 (eight) hours as needed for nausea or vomiting.   PROMETHAZINE (PHENERGAN) 12.5 MG TABLET    Take 1 tablet (  12.5 mg total) by mouth every 6 (six) hours as needed.     Note:  This document was prepared using Dragon voice recognition software and may include unintentional dictation errors.    Willy Eddy, MD 09/15/19 [phone]

## 2019-09-15 NOTE — ED Triage Notes (Signed)
C/O sore throat Monday.  Vomiting started Tuesday and fever.  Patient had COVID testing today at [facility].  Currently taking antibiotic for strep, unable to tolerate anything PO.  Reports a 10 lb weight loss since Monday.  AAOx3.  Skin warm and dry. NAD

## 2019-09-15 NOTE — ED Notes (Signed)
pCXR at bedside

## 2019-09-15 NOTE — Telephone Encounter (Signed)
For several days patient has been sick vomiting says he is not holding down any fluids, food, or his abx. Told patient to seek care at the ER to get evaluated and treated. Told him he may need fluids and we don't have the equipment to do that here.   He verbalized understanding.

## 2019-09-15 NOTE — ED Notes (Addendum)
Pt given ice water and juice. Pt given crackers per EDP.

## 2019-09-17 LAB — NOVEL CORONAVIRUS, NAA: SARS-CoV-2, NAA: NOT DETECTED

## 2020-04-02 ENCOUNTER — Telehealth: Payer: Self-pay | Admitting: Internal Medicine

## 2020-04-02 NOTE — Telephone Encounter (Signed)
Patient called to ask the nurse or doctor to call him regarding an injury he said he had.  He did not want to give any more details.  He just would like advice as to if he should schedule an appt.  Please call to discuss at [phone]

## 2020-04-02 NOTE — Telephone Encounter (Signed)
Called pt scheduled appt to be seen tomorrow. 04/02/2020. Pt has had a bruise X4-5 weeks is the size of a golf ball and also raised doesn't seen to be getting better or going away.  KP

## 2020-04-03 ENCOUNTER — Ambulatory Visit: Payer: BC Managed Care – PPO | Admitting: Internal Medicine

## 2020-04-03 ENCOUNTER — Other Ambulatory Visit: Payer: Self-pay

## 2020-04-03 ENCOUNTER — Encounter: Payer: Self-pay | Admitting: Internal Medicine

## 2020-04-03 VITALS — BP 144/78 | HR 64 | Temp 97.9°F | Ht 72.0 in | Wt 163.0 lb

## 2020-04-03 DIAGNOSIS — S8011XA Contusion of right lower leg, initial encounter: Secondary | ICD-10-CM | POA: Diagnosis not present

## 2020-04-03 NOTE — Progress Notes (Signed)
Date:  04/03/2020   Name:  Nathan Guerrero [facility]   DOB:  13-Dec-1985   MRN:  [id number]   Chief Complaint: bruise on calf (X5 weeks, right calf, hard and tight for X2 weeks, not painful , got from playing sports)  Leg Pain  The injury mechanism was a direct blow. The pain is present in the right leg. Quality: minimal discomfort. He has tried heat for the symptoms. The treatment provided mild relief.  Initially struck by a lacrosse stick on his mid right calf.  There was a large bruise with discoloration that resolved over 2-3 weeks.  Now he still has an area that is firm in the same area.  No ankle swelling, redness or pain.  No discomfort to regular activities.  He has used some heat which is comforting.  Lab Results  Component Value Date   CREATININE 0.96 09/15/2019   BUN 21 (H) 09/15/2019   NA 138 09/15/2019   K 4.2 09/15/2019   CL 101 09/15/2019   CO2 21 (L) 09/15/2019   No results found for: CHOL, HDL, LDLCALC, LDLDIRECT, TRIG, CHOLHDL Lab Results  Component Value Date   TSH 1.180 09/16/2017   No results found for: HGBA1C Lab Results  Component Value Date   WBC 12.0 (H) 09/15/2019   HGB 16.9 09/15/2019   HCT 47.9 09/15/2019   MCV 83.6 09/15/2019   PLT 300 09/15/2019   Lab Results  Component Value Date   ALT 15 09/15/2019   AST 17 09/15/2019   ALKPHOS 81 09/15/2019   BILITOT 1.6 (H) 09/15/2019     Review of Systems  Constitutional: Negative for chills, fatigue and fever.  Respiratory: Negative for chest tightness and shortness of breath.   Cardiovascular: Negative for chest pain, palpitations and leg swelling.  Musculoskeletal: Positive for myalgias. Negative for arthralgias and joint swelling.  Skin: Positive for color change (and firm mass on right calf).    Patient Active Problem List   Diagnosis Date Noted  . Irritable bowel syndrome 04/20/2018  . Mouth ulcers 08/21/2016  . Rotator cuff syndrome of left shoulder 04/25/2015  . Underweight 04/25/2015      No Known Allergies  Past Surgical History:  Procedure Laterality Date  . WISDOM TOOTH EXTRACTION      Social History   Tobacco Use  . Smoking status: Never Smoker  . Smokeless tobacco: Never Used  Vaping Use  . Vaping Use: Never used  Substance Use Topics  . Alcohol use: Yes    Alcohol/week: 0.0 standard drinks  . Drug use: No     Medication list has been reviewed and updated.  Current Meds  Medication Sig  . acetaminophen (TYLENOL) 325 MG tablet Take 500 mg by mouth every 6 (six) hours as needed.   Marland Kitchen albuterol (PROVENTIL HFA;VENTOLIN HFA) 108 (90 Base) MCG/ACT inhaler Inhale 2 puffs into the lungs every 6 (six) hours as needed for wheezing or shortness of breath.  . dicyclomine (BENTYL) 10 MG capsule Take 1 capsule (10 mg total) by mouth 4 (four) times daily as needed for spasms.  . Multiple Vitamin (MULTIVITAMIN) capsule Take 1 capsule by mouth daily.    PHQ 2/9 Scores 04/03/2020 09/13/2019 12/31/2018 11/08/2018  PHQ - 2 Score 0 0 0 0  PHQ- 9 Score 2 - - -    GAD 7 : Generalized Anxiety Score 04/03/2020  Nervous, Anxious, on Edge 0  Control/stop worrying 0  Worry too much - different things 0  Trouble relaxing 0  Restless 0  Easily annoyed or irritable 0  Afraid - awful might happen 0  Total GAD 7 Score 0  Anxiety Difficulty Not difficult at all    BP Readings from Last 3 Encounters:  04/03/20 (!) 144/78  09/15/19 140/80  12/31/18 112/64    Physical Exam Vitals and nursing note reviewed.  Constitutional:      General: He is not in acute distress.    Appearance: Normal appearance. He is well-developed.  HENT:     Head: Normocephalic and atraumatic.  Cardiovascular:     Rate and Rhythm: Normal rate and regular rhythm.     Pulses:          Dorsalis pedis pulses are 1+ on the right side and 1+ on the left side.       Posterior tibial pulses are 1+ on the right side and 1+ on the left side.  Pulmonary:     Effort: Pulmonary effort is normal. No  respiratory distress.     Breath sounds: No wheezing or rhonchi.  Musculoskeletal:        General: Normal range of motion.     Cervical back: Normal range of motion.     Right lower leg: No edema.     Left lower leg: No edema.       Legs:     Comments: Two areas about the size of a golf ball that are firm and subcutaneous c/w hx of bleeding from blunt trauma No evidence of DVT  Skin:    General: Skin is warm and dry.     Findings: No rash.  Neurological:     Mental Status: He is alert and oriented to person, place, and time.  Psychiatric:        Behavior: Behavior normal.        Thought Content: Thought content normal.     Wt Readings from Last 3 Encounters:  04/03/20 163 lb (73.9 kg)  09/15/19 160 lb 15 oz (73 kg)  09/13/19 161 lb (73 kg)    BP (!) 144/78   Pulse 64   Temp 97.9 F (36.6 C) (Oral)   Ht 6' (1.829 m)   Wt 163 lb (73.9 kg)   SpO2 98%   BMI 22.11 kg/m   Assessment and Plan: 1. Traumatic ecchymosis of right lower leg, initial encounter Continue to use heat as needed Activity as tolerated Expect resolution slowly over the next 3-4 weeks   Partially dictated using Dragon software. Any errors are unintentional.  [name], MD [facility]  04/03/2020

## 2020-05-25 ENCOUNTER — Telehealth: Payer: Self-pay | Admitting: Internal Medicine

## 2020-05-25 NOTE — Telephone Encounter (Signed)
Copied from CRM [id number]. Topic: General - Inquiry >> May 25, 2020  9:26 AM Daphine Deutscher D wrote: Reason for CRM: Pt had a positive covid test and wants to talk to Dr. Arliss Journey nurse.  He said he did talk to a triage nurse but would like to talk with Dr. Asencion Partridge or her nurse.  CB#  [phone]

## 2020-05-28 ENCOUNTER — Telehealth: Payer: Self-pay | Admitting: Internal Medicine

## 2020-05-28 NOTE — Telephone Encounter (Signed)
Tried calling the patient but he did not answer. Left him a VM letting him know we were on vacation when he first called and we just opened at 8am. We are trying to see patients and go through over 20 messages so I was going to call him back -just needed some time to get through all of these messages with seeing patients.   Let him know the only thing he can do for Covid is take tylenol every 6 hours as needed, rest and stay hydrated. Told him on the VM since its a virus it does need to run its course and there is nothing we can give him medicine wise to speed up the process. Apologized for calling him back late, and told him to call back if he has any further questions.   CM

## 2020-05-28 NOTE — Telephone Encounter (Unsigned)
Copied from CRM [id number]. Topic: General - Other >> May 25, 2020  4:54 PM Dalphine Handing A wrote: Patient is very unhappy with not receiveing a callback today in regards to his positive covid test results. Patient stated that he has already spoke with the [facility] traige nurse today, however he feels that he wasn't given enough and would like to speak with Dr. Jaclynn Guarneri nurse. Patient also would like clarification on the office hourse for the practice. Please contact patient as soon as possible at [phone]

## 2020-05-28 NOTE — Telephone Encounter (Signed)
Patient called again this morning and another CRM was put in. I will document on that one and call the patient now.   CM

## 2020-06-03 ENCOUNTER — Encounter: Payer: Self-pay | Admitting: Emergency Medicine

## 2020-06-03 ENCOUNTER — Other Ambulatory Visit: Payer: Self-pay

## 2020-06-03 ENCOUNTER — Ambulatory Visit
Admission: EM | Admit: 2020-06-03 | Discharge: 2020-06-03 | Disposition: A | Discharge status: Home / Self Care | Payer: BC Managed Care – PPO | Attending: Emergency Medicine | Admitting: Emergency Medicine

## 2020-06-03 DIAGNOSIS — R21 Rash and other nonspecific skin eruption: Secondary | ICD-10-CM | POA: Diagnosis not present

## 2020-06-03 MED ORDER — METHYLPREDNISOLONE 4 MG PO TBPK: ORAL_TABLET | ORAL | 0 refills | Status: DC

## 2020-06-03 MED ORDER — TRIAMCINOLONE ACETONIDE 0.1 % EX CREA: 1.0000 "application " | TOPICAL_CREAM | Freq: Two times a day (BID) | CUTANEOUS | 0 refills | Status: DC

## 2020-06-03 NOTE — ED Triage Notes (Signed)
Patient c/o itchy red rash on his left wrist, arms, back and buttock that started yesterday.

## 2020-06-03 NOTE — ED Provider Notes (Signed)
[facility]    CSN: [id number] Arrival date & time: 06/03/20  5188      History   Chief Complaint Chief Complaint  Patient presents with  . Rash    HPI Nathan Guerrero is a 34 y.o. male.   HPI  34 year old male presents with a rash started approximately 2 to 3 days ago.  States that started as a small rash under his watchband on his left wrist but has since spread to his legs and buttocks upper extremities.  It is sparing his face and torso.  He states it is itchy.  No weeping.  Not taking any t drugs recently; has not changed foods.  He has not changed deodorant or creams or detergent.  He has done some work in the yard this was mostly confined to weed eating and mowing.  The rash is red is blanchable has a papular confluent splotchy configuration.  He has had no fever.  He has had no general illnesses.        Past Medical History:  Diagnosis Date  . IBS (irritable bowel syndrome)     Patient Active Problem List   Diagnosis Date Noted  . Irritable bowel syndrome 04/20/2018  . Mouth ulcers 08/21/2016  . Rotator cuff syndrome of left shoulder 04/25/2015  . Underweight 04/25/2015    Past Surgical History:  Procedure Laterality Date  . WISDOM TOOTH EXTRACTION         Home Medications    Prior to Admission medications   Medication Sig Start Date End Date Taking? Authorizing Provider  acetaminophen (TYLENOL) 325 MG tablet Take 500 mg by mouth every 6 (six) hours as needed.     [provider]  albuterol (PROVENTIL HFA;VENTOLIN HFA) 108 (90 Base) MCG/ACT inhaler Inhale 2 puffs into the lungs every 6 (six) hours as needed for wheezing or shortness of breath. 09/21/18   Reubin Milan, MD  dicyclomine (BENTYL) 10 MG capsule Take 1 capsule (10 mg total) by mouth 4 (four) times daily as needed for spasms. 04/20/18   Reubin Milan, MD  methylPREDNISolone (MEDROL DOSEPAK) 4 MG TBPK tablet Take per package instructions 06/03/20   Lutricia Feil, PA-C  Multiple Vitamin (MULTIVITAMIN) capsule Take 1 capsule by mouth daily.    [provider]  triamcinolone cream (KENALOG) 0.1 % Apply 1 application topically 2 (two) times daily. 06/03/20   Lutricia Feil, PA-C  promethazine (PHENERGAN) 12.5 MG tablet Take 1 tablet (12.5 mg total) by mouth every 6 (six) hours as needed. 09/15/19 09/15/19  Willy Eddy, MD    Family History History reviewed. No pertinent family history.  Social History Social History   Tobacco Use  . Smoking status: Never Smoker  . Smokeless tobacco: Never Used  Vaping Use  . Vaping Use: Never used  Substance Use Topics  . Alcohol use: Yes    Alcohol/week: 0.0 standard drinks  . Drug use: No     Allergies   Patient has no known allergies.   Review of Systems Review of Systems  Constitutional: Positive for activity change and fatigue. Negative for appetite change, chills and fever.  Skin: Positive for rash.  All other systems reviewed and are negative.    Physical Exam Triage Vital Signs ED Triage Vitals  Enc Vitals Group     BP 06/03/20 0922 125/70     Pulse Rate 06/03/20 0922 72     Resp 06/03/20 0922 16     Temp 06/03/20 0922 97.9  F (36.6 C)     Temp Source 06/03/20 0922 Oral     SpO2 06/03/20 0922 100 %     Weight 06/03/20 0920 165 lb (74.8 kg)     Height 06/03/20 0920 6' (1.829 m)     Head Circumference --      Peak Flow --      Pain Score 06/03/20 0920 2     Pain Loc --      Pain Edu? --      Excl. in GC? --           Updated Vital Signs BP 125/70 (BP Location: Left Arm)   Pulse 72   Temp 97.9 F (36.6 C) (Oral)   Resp 16   Ht 6' (1.829 m)   Wt 165 lb (74.8 kg)   SpO2 100%   BMI 22.38 kg/m   Visual Acuity Right Eye Distance:   Left Eye Distance:   Bilateral Distance:    Right Eye Near:   Left Eye Near:    Bilateral Near:     Physical Exam Vitals and nursing note reviewed.  Constitutional:      General: He is not in acute  distress.    Appearance: Normal appearance. He is normal weight. He is not ill-appearing or toxic-appearing.  HENT:     Head: Normocephalic and atraumatic.  Eyes:     Conjunctiva/sclera: Conjunctivae normal.  Musculoskeletal:        General: Normal range of motion.     Cervical back: Normal range of motion and neck supple.  Skin:    Findings: Erythema and rash present.     Comments: Examination of the skin and refer to attached photographs.  The rash is erythematous which blanches.  There is a papular with a small 1 mm nonpustular papules present.  Is confluent.  It is confined to the buttocks and lower extremities the upper extremities including the wrist and forearms but is sparing the face and also the torso.  Patient has no fever.  Denies any malaise or other systemic symptoms.  Neurological:     General: No focal deficit present.     Mental Status: He is alert and oriented to person, place, and time.  Psychiatric:        Mood and Affect: Mood normal.        Behavior: Behavior normal.        Thought Content: Thought content normal.        Judgment: Judgment normal.      UC Treatments / Results  Labs (all labs ordered are listed, but only abnormal results are displayed) Labs Reviewed - No data to display  EKG   Radiology No results found.  Procedures Procedures (including critical care time)  Medications Ordered in UC Medications - No data to display  Initial Impression / Assessment and Plan / UC Course  I have reviewed the triage vital signs and the nursing notes.  Pertinent labs & imaging results that were available during my care of the patient were reviewed by me and considered in my medical decision making (see chart for details).   80-year-old male presents with a itchy red rash on his buttocks lower extremities upper extremities but sparing the face and torso.  Started yesterday.  He denies any constitutional symptoms such as malaise or fever.  The rash is  itchy.  States that it even is uncomfortable to wear clothing over the area on his buttocks.  He states it seems to  be spreading more.  Refer to photographs for detail of the rash.  I told him it is uncertain what caused the rash but is likely an allergic reaction.  I placed him on a Medrol Dosepak, given him topical steroids for very sparing use and recommended use of Zyrtec Allegra or Claritin for daytime itching and Benadryl for nighttime.  If he does not notice an improvement after couple days of the prednisone he should contact dermatology for further evaluation and care.      Final Clinical Impressions(s) / UC Diagnoses   Final diagnoses:  Rash     Discharge Instructions     Use Benadryl at nighttime 25 to 50 mg and Claritin Allegra or Zyrtec during the daytime for severe itching. Apply triamcinolone cream sparingly for areas that are most bothersome. If you are not improving in several days schedule appointment for dermatology.    ED Prescriptions    Medication Sig Dispense Auth. Provider   methylPREDNISolone (MEDROL DOSEPAK) 4 MG TBPK tablet Take per package instructions 21 tablet Lutricia Feil, PA-C   triamcinolone cream (KENALOG) 0.1 % Apply 1 application topically 2 (two) times daily. 30 g Lutricia Feil, PA-C     PDMP not reviewed this encounter.   Lutricia Feil, PA-C 06/03/20 1738

## 2020-06-03 NOTE — Discharge Instructions (Addendum)
Use Benadryl at nighttime 25 to 50 mg and Claritin Allegra or Zyrtec during the daytime for severe itching. Apply triamcinolone cream sparingly for areas that are most bothersome. If you are not improving in several days schedule appointment for dermatology.

## 2020-06-04 ENCOUNTER — Telehealth: Payer: Self-pay

## 2020-06-04 NOTE — Telephone Encounter (Signed)
Called and spoke with patient after receiving an after hours FAX. Patient stated he was supposed to return to work today - however he developed a bad itching, red rash that started Friday. He had to be seen at Urgent Care in [facility] yesterday for treatment. He was given prednisone, triamcinolone cream, and told to take Claritin and benadryl.   He is still taking the prednisone, but almost is completely out of the cream. Wanted advice on when he should return to work with this rash.   Spoke with Dr Judithann Graves and she agreed he needs a few days to get rid of the rash before returning to work. Printed letter and Faxed to his HR for him to return to work on 06/07/2020.   Patient verbalized understanding.  CM

## 2020-07-13 ENCOUNTER — Telehealth: Payer: Self-pay

## 2020-07-13 NOTE — Telephone Encounter (Signed)
Copied from CRM [id number]. Topic: General - Inquiry >> Jul 12, 2020  5:56 PM Nathan Guerrero wrote: Reason for CRM: Patient called and would like to get a few x-rays done on his right hand. He has 2 knuckles/ 2 fingers (right pinky and right ring finger) that may have some fractures in them. He wants to receive a call back, he can be reached at [phone]. Please advise

## 2020-07-13 NOTE — Telephone Encounter (Signed)
Called pt left VM that Dr. Judithann Graves is not in the office and she will return Monday,07/16/2020. Pt needs to go to UC or [facility]. If he wants to wait until Dr. Judithann Graves returns call the office to schedule an appt. Pts name stated on VM.  KP

## 2020-07-16 NOTE — Telephone Encounter (Signed)
Pt is calling to schedule an appt with dr berglund for 11-11 or 11-12 around 1120 am or either days. Pt said he was told to callback on Monday for an appt with dr berglund on nov 11 or 12. Pt is having right hand pain for about 2 wks. Please advide

## 2020-07-19 ENCOUNTER — Other Ambulatory Visit: Payer: Self-pay

## 2020-07-19 ENCOUNTER — Ambulatory Visit
Admission: RE | Admit: 2020-07-19 | Discharge: 2020-07-19 | Disposition: A | Discharge status: Home / Self Care | Payer: BC Managed Care – PPO | Attending: Internal Medicine | Admitting: Internal Medicine

## 2020-07-19 ENCOUNTER — Ambulatory Visit
Admission: RE | Admit: 2020-07-19 | Discharge: 2020-07-19 | Disposition: A | Discharge status: Home / Self Care | Payer: BC Managed Care – PPO | Source: Ambulatory Visit | Attending: Internal Medicine | Admitting: Internal Medicine

## 2020-07-19 ENCOUNTER — Encounter: Payer: Self-pay | Admitting: Internal Medicine

## 2020-07-19 ENCOUNTER — Ambulatory Visit (INDEPENDENT_AMBULATORY_CARE_PROVIDER_SITE_OTHER): Payer: BC Managed Care – PPO | Admitting: Internal Medicine

## 2020-07-19 VITALS — BP 114/76 | HR 65 | Ht 72.0 in | Wt 162.0 lb

## 2020-07-19 DIAGNOSIS — M79641 Pain in right hand: Secondary | ICD-10-CM | POA: Diagnosis not present
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
## 2020-09-20 ENCOUNTER — Telehealth: Payer: Self-pay

## 2020-09-20 NOTE — Telephone Encounter (Signed)
Copied from CRM [id number]. Topic: General - Other >> Sep 20, 2020  9:28 AM Lyn Hollingshead D wrote: Pt need to speak with a nurse / personal issues / please advise

## 2020-09-20 NOTE — Telephone Encounter (Signed)
Pt called to give a reason for his appt. Difficulty sleeping, anxiety and depression. Pt has an appt 09/25/2020.  KP

## 2020-09-25 ENCOUNTER — Ambulatory Visit: Payer: BC Managed Care – PPO | Admitting: Internal Medicine

## 2020-10-08 ENCOUNTER — Telehealth: Payer: Self-pay

## 2020-10-08 NOTE — Telephone Encounter (Unsigned)
Copied from CRM [id number]. Topic: General - Other >> Oct 08, 2020 10:39 AM Lyn Hollingshead D wrote: Pt call to reschedule his appt // Anxiety, depression, diffculty sleeping, personal issues // please advise

## 2020-10-08 NOTE — Telephone Encounter (Signed)
See other telephone encounter from today - 10/08/2020. Patient scheduled for 10/19/2020.

## 2020-10-08 NOTE — Telephone Encounter (Signed)
Patient called into the clinic saying he needs to be seen at soon as possible. He is struggling with insomnia, anxiety and depression. Wants to make an appointment for this Friday. Has difficulty scheduling appointments due to his work schedule and he is also a couch to an athletic team. Dr. Judithann Graves is off work this Friday and unavailable. Wants to be seen by Dr Yetta Barre since Dr Judithann Graves is unavailable. Explained that Dr. Yetta Barre only see's Dr. Karn Cassis patients for acute problems when she is out of the office. Tried to schedule and offer patient any available times this week and he declined all of them saying they do not work with his work schedule.  Explained to patient I understand he has bills but his health is more important than his work and he needs to be able to sleep and function properly. He still declined available appts this week. Asked for an appt next Friday. Scheduled patient Friday 10/19/2020 at 11:40 AM. He verbalized understanding that if symptoms get worse to call to see if he can be fit in another time slot sooner.  Mariel Sleet, CMA (AAMA)

## 2020-10-19 ENCOUNTER — Encounter: Payer: Self-pay | Admitting: Internal Medicine

## 2020-10-19 ENCOUNTER — Other Ambulatory Visit: Payer: Self-pay

## 2020-10-19 ENCOUNTER — Ambulatory Visit (INDEPENDENT_AMBULATORY_CARE_PROVIDER_SITE_OTHER): Payer: BC Managed Care – PPO | Admitting: Internal Medicine

## 2020-10-19 VITALS — BP 124/78 | HR 63 | Ht 72.0 in | Wt 159.0 lb

## 2020-10-19 DIAGNOSIS — F321 Major depressive disorder, single episode, moderate: Secondary | ICD-10-CM | POA: Diagnosis not present

## 2020-10-19 HISTORY — DX: Major depressive disorder, single episode, moderate: F32.1

## 2020-10-19 MED ORDER — ESCITALOPRAM OXALATE 10 MG PO TABS: 10.0000 mg | ORAL_TABLET | Freq: Every day | ORAL | 1 refills | Status: DC

## 2020-10-19 NOTE — Progress Notes (Signed)
Date:  10/19/2020   Name:  Nathan Guerrero [facility]   DOB:  30-Aug-1986   MRN:  [id number]   Chief Complaint: Anxiety and Depression  Depression        This is a new problem.  The problem occurs constantly.The problem is unchanged.  Associated symptoms include decreased concentration, fatigue, hopelessness, decreased interest and appetite change.  Associated symptoms include no body aches, no headaches and no suicidal ideas.  Past treatments include nothing. He has become very stressed with work and home pressures.  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   He has 2 small children at home and feels that he is not spending enough time with them.  He is tired at the end of the day, falls asleep earlier in the evening then wakes early the next day and can not go back to sleep.  He has not tried any medication to help with sleep. He had some issues with depression and anxiety several years ago - much milder - was prescribed lexapro but he never took it.  That episode resolved after a few months.  Now He feels very indecisive with highs and lows of mood. He would consider counseling but doesn't feel that he really has the time.  Lab Results  Component Value Date   CREATININE 0.96 09/15/2019   BUN 21 (H) 09/15/2019   NA 138 09/15/2019   K 4.2 09/15/2019   CL 101 09/15/2019   CO2 21 (L) 09/15/2019   No results found for: CHOL, HDL, LDLCALC, LDLDIRECT, TRIG, CHOLHDL Lab Results  Component Value Date   TSH 1.180 09/16/2017   No results found for: HGBA1C Lab Results  Component Value Date   WBC 12.0 (H) 09/15/2019   HGB 16.9 09/15/2019   HCT 47.9 09/15/2019   MCV 83.6 09/15/2019   PLT 300 09/15/2019   Lab Results  Component Value Date   ALT 15 09/15/2019   AST 17 09/15/2019   ALKPHOS 81  09/15/2019   BILITOT 1.6 (H) 09/15/2019     Review of Systems  Constitutional: Positive for appetite change and fatigue. Negative for unexpected weight change.  Respiratory: Negative for chest tightness and shortness of breath.   Cardiovascular: Negative for chest pain.  Neurological: Negative for dizziness and headaches.  Psychiatric/Behavioral: Positive for decreased concentration, depression and sleep disturbance. Negative for suicidal ideas. The patient is nervous/anxious.     Patient Active Problem List   Diagnosis Date Noted  . Irritable bowel syndrome 04/20/2018  . Mouth ulcers 08/21/2016  . Rotator cuff syndrome of left shoulder 04/25/2015  . Underweight 04/25/2015    No Known Allergies  Past Surgical History:  Procedure Laterality Date  . WISDOM TOOTH EXTRACTION      Social History   Tobacco Use  . Smoking status: Never Smoker  . Smokeless tobacco: Never Used  Vaping Use  . Vaping Use: Never used  Substance Use Topics  . Alcohol use: Yes    Alcohol/week: 0.0 standard drinks  . Drug use: No     Medication list has been reviewed and updated.  Current Meds  Medication Sig  . acetaminophen (TYLENOL) 325 MG tablet Take 500 mg by mouth every 6 (six) hours as needed.   Marland Kitchen albuterol (PROVENTIL HFA;VENTOLIN HFA) 108 (90 Base)  MCG/ACT inhaler Inhale 2 puffs into the lungs every 6 (six) hours as needed for wheezing or shortness of breath.  . dicyclomine (BENTYL) 10 MG capsule Take 1 capsule (10 mg total) by mouth 4 (four) times daily as needed for spasms.  . Multiple Vitamin (MULTIVITAMIN) capsule Take 1 capsule by mouth daily.    PHQ 2/9 Scores 10/19/2020 07/19/2020 04/03/2020 09/13/2019  PHQ - 2 Score 2 0 0 0  PHQ- 9 Score 17 0 2 -    GAD 7 : Generalized Anxiety Score 10/19/2020 10/19/2020 07/19/2020 04/03/2020  Nervous, Anxious, on Edge 3 0 0 0  Control/stop worrying 3 0 0 0  Worry too much - different things 3 0 0 0  Trouble relaxing 3 0 0 0  Restless 3 0 0 0   Easily annoyed or irritable 2 0 0 0  Afraid - awful might happen 2 0 0 0  Total GAD 7 Score 19 0 0 0  Anxiety Difficulty Very difficult Not difficult at all Not difficult at all Not difficult at all    BP Readings from Last 3 Encounters:  10/19/20 124/78  07/19/20 114/76  06/03/20 125/70    Physical Exam Vitals and nursing note reviewed.  Constitutional:      General: He is not in acute distress.    Appearance: Normal appearance. He is well-developed.  HENT:     Head: Normocephalic and atraumatic.  Pulmonary:     Effort: Pulmonary effort is normal. No respiratory distress.  Musculoskeletal:        General: Normal range of motion.  Skin:    General: Skin is warm and dry.     Findings: No rash.  Neurological:     General: No focal deficit present.     Mental Status: He is alert and oriented to person, place, and time.  Psychiatric:        Attention and Perception: Attention normal.        Mood and Affect: Mood is anxious and depressed.        Speech: Speech normal.        Behavior: Behavior normal.        Thought Content: Thought content does not include suicidal ideation. Thought content does not include suicidal plan.        Cognition and Memory: Cognition normal.        Judgment: Judgment normal.     Wt Readings from Last 3 Encounters:  10/19/20 159 lb (72.1 kg)  07/19/20 162 lb (73.5 kg)  06/03/20 165 lb (74.8 kg)    BP 124/78   Pulse 63   Ht 6' (1.829 m)   Wt 159 lb (72.1 kg)   SpO2 98%   BMI 21.56 kg/m   Assessment and Plan: 1. Current moderate episode of major depressive disorder without prior episode (HCC) Depression with anxiety; sleep disturbance but without SI/HI Recommend trying Melatonin for sleep and beginning SSRI Follow up in 2-3 months - sooner if medication issues Consider counseling - intended to provide a list of local services but was unintentionally left out - escitalopram (LEXAPRO) 10 MG tablet; Take 1 tablet (10 mg total) by mouth  daily.  Dispense: 30 tablet; Refill: 1   Partially dictated using Animal nutritionist. Any errors are unintentional.  [name], MD [facility]  10/19/2020

## 2021-03-22 ENCOUNTER — Ambulatory Visit: Payer: BC Managed Care – PPO | Admitting: Internal Medicine

## 2021-03-22 ENCOUNTER — Other Ambulatory Visit: Payer: Self-pay | Admitting: Internal Medicine

## 2021-03-22 ENCOUNTER — Telehealth: Payer: Self-pay | Admitting: Internal Medicine

## 2021-03-22 DIAGNOSIS — K121 Other forms of stomatitis: Secondary | ICD-10-CM

## 2021-03-22 MED ORDER — MAGIC MOUTHWASH W/LIDOCAINE: 5.0000 mL | Freq: Four times a day (QID) | ORAL | 0 refills | Status: DC

## 2021-03-22 NOTE — Telephone Encounter (Signed)
Please call pt to schedule an appt. Pt will need to come in we have not seen the pt for this problem since 2017.   KP

## 2021-03-22 NOTE — Telephone Encounter (Signed)
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 And is requesting the medication to be sent to the below pharmacy.  Has the patient contacted their pharmacy? YES  (Agent: If no, request that the patient contact the pharmacy for the refill.) (Agent: If yes, when and what did the pharmacy advise?)  Preferred Pharmacy (with phone number or street name): [facility] [phone] [facility], [place] - [address] [place] [address] Phone: [phone] Fax: [phone] Hours: Not open 24 hours    Agent: Please be advised that RX refills may take up to 3 business days. We ask that you follow-up with your pharmacy.

## 2021-06-15 IMAGING — CR DG HAND COMPLETE 3+V*R*
3 series · 3 of 3 positions shown · non-contrast
Comparison: None.

CLINICAL DATA: Right hand pain near fifth MCP joint

EXAM:
RIGHT HAND - COMPLETE 3+ VIEW

[hand ap]
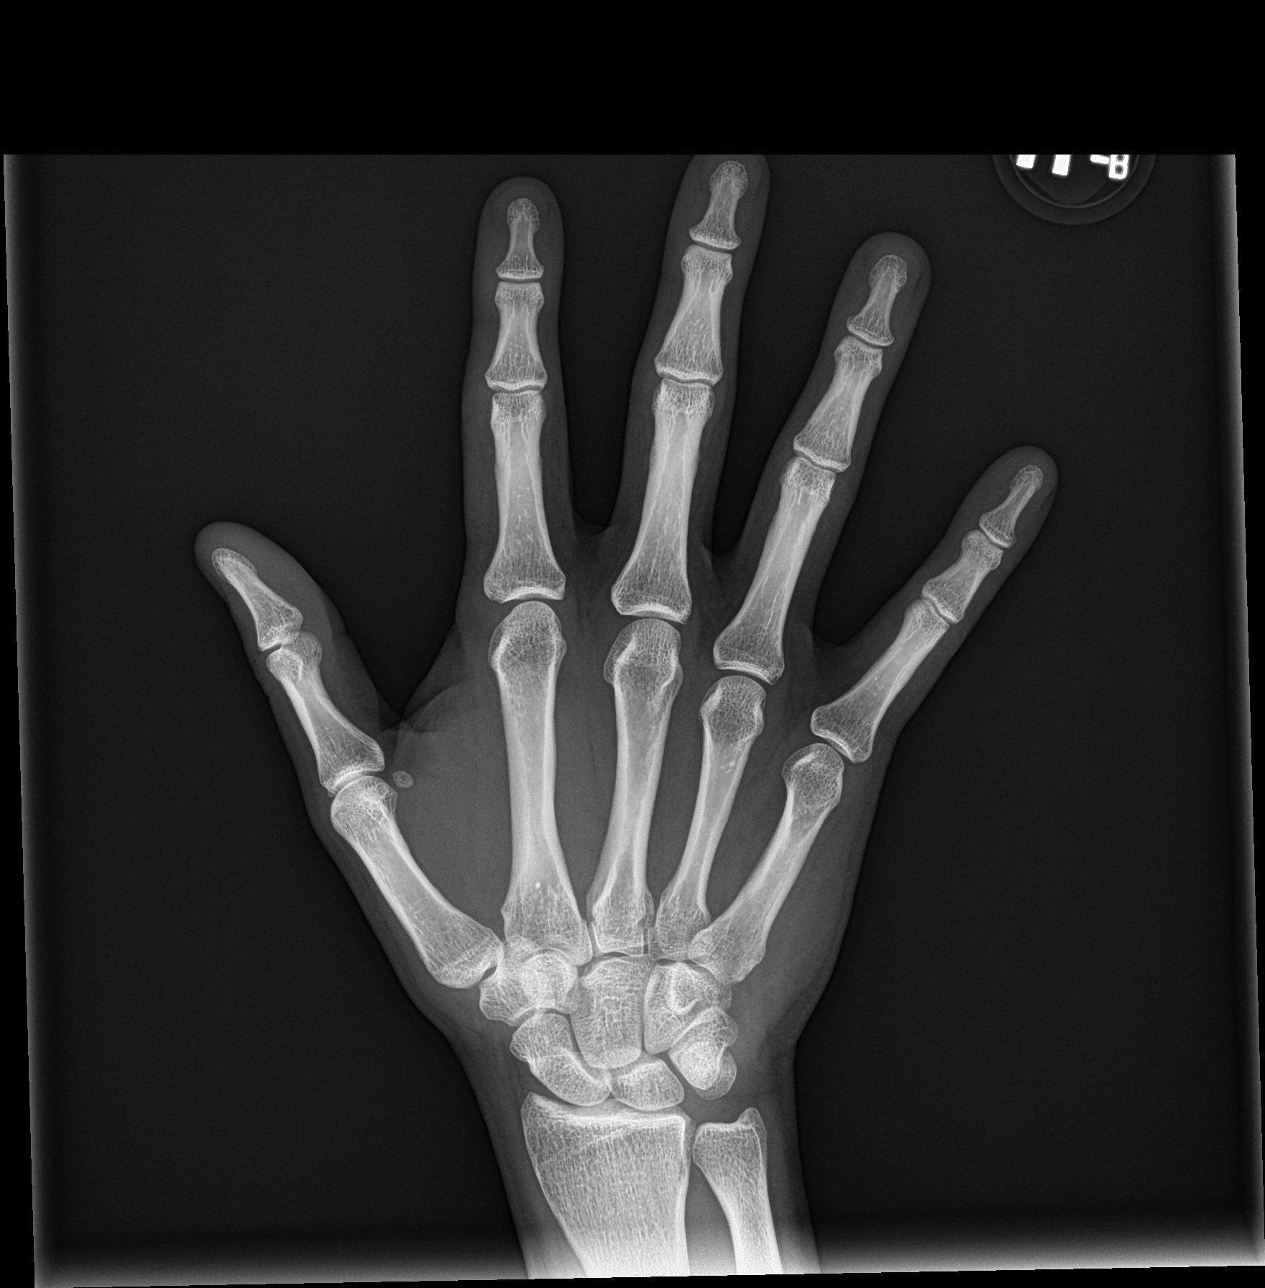

[hand obl]
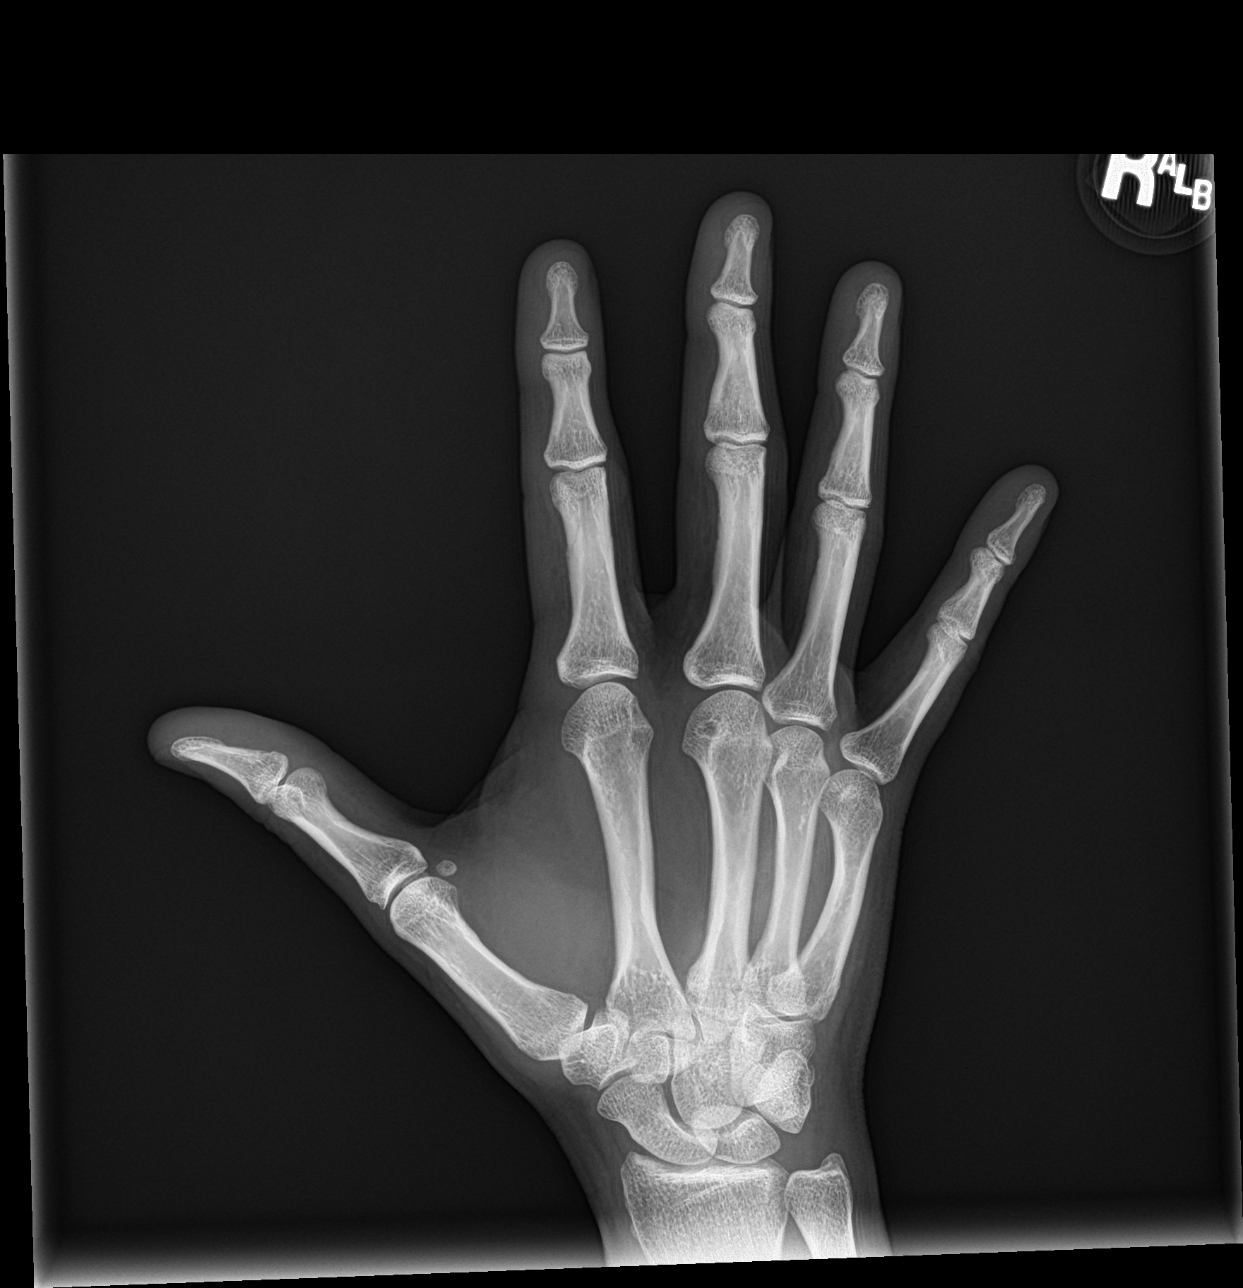

[hand lat]
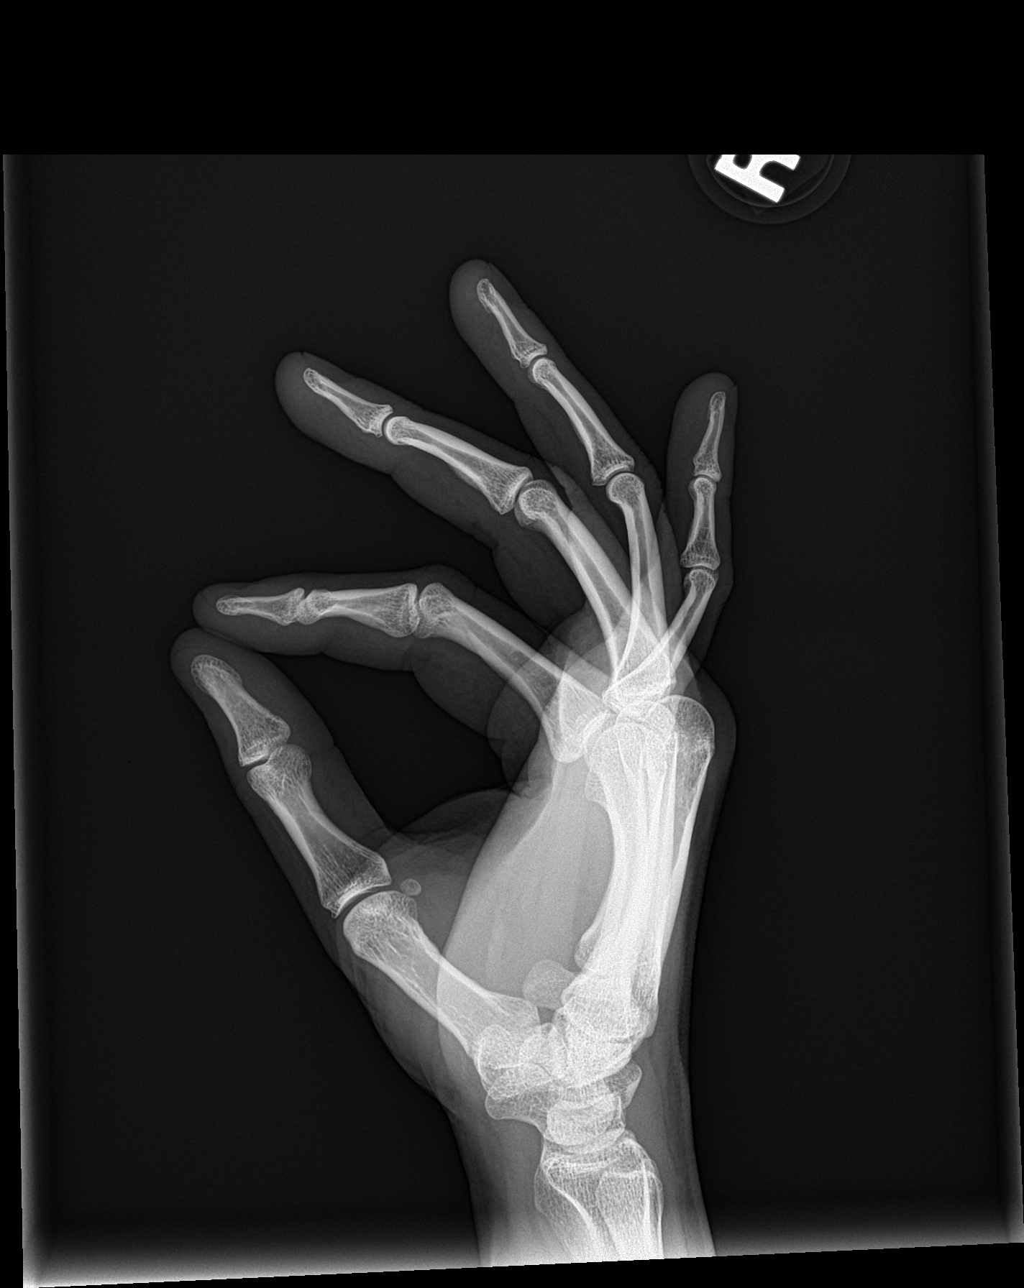

[3 of 3 positions shown; findings below may reference images not displayed]

FINDINGS: There is no evidence of fracture or dislocation. There is no
evidence of arthropathy or other focal bone abnormality. Soft
tissues are unremarkable.
IMPRESSION: Negative.

## 2021-07-16 ENCOUNTER — Other Ambulatory Visit: Payer: Self-pay

## 2021-07-16 ENCOUNTER — Ambulatory Visit
Admission: RE | Admit: 2021-07-16 | Discharge: 2021-07-16 | Disposition: A | Discharge status: Home / Self Care | Payer: BC Managed Care – PPO | Source: Ambulatory Visit | Attending: Internal Medicine | Admitting: Internal Medicine

## 2021-07-16 ENCOUNTER — Encounter: Payer: Self-pay | Admitting: Internal Medicine

## 2021-07-16 ENCOUNTER — Ambulatory Visit
Admission: RE | Admit: 2021-07-16 | Discharge: 2021-07-16 | Disposition: A | Discharge status: Home / Self Care | Payer: BC Managed Care – PPO | Attending: Internal Medicine | Admitting: Internal Medicine

## 2021-07-16 ENCOUNTER — Ambulatory Visit (INDEPENDENT_AMBULATORY_CARE_PROVIDER_SITE_OTHER): Payer: BC Managed Care – PPO | Admitting: Internal Medicine

## 2021-07-16 VITALS — BP 130/78 | HR 82 | Ht 72.0 in | Wt 174.0 lb

## 2021-07-16 DIAGNOSIS — M25542 Pain in joints of left hand: Secondary | ICD-10-CM | POA: Diagnosis present

## 2021-07-16 NOTE — Progress Notes (Signed)
Date:  07/16/2021   Name:  Nathan Guerrero [facility]   DOB:  1986/02/17   MRN:  [id number]   Chief Complaint: Hand Pain  Hand Pain  The incident occurred more than 1 week ago. The injury mechanism was a direct blow Engineer, site). Pain location: Left Ring Finger. The pain is moderate. The pain has been Worsening since the incident.  He collided with another student's hand while playing basketball at school on Thursday last week.  Lab Results  Component Value Date   CREATININE 0.96 09/15/2019   BUN 21 (H) 09/15/2019   NA 138 09/15/2019   K 4.2 09/15/2019   CL 101 09/15/2019   CO2 21 (L) 09/15/2019   No results found for: CHOL, HDL, LDLCALC, LDLDIRECT, TRIG, CHOLHDL Lab Results  Component Value Date   TSH 1.180 09/16/2017   No results found for: HGBA1C Lab Results  Component Value Date   WBC 12.0 (H) 09/15/2019   HGB 16.9 09/15/2019   HCT 47.9 09/15/2019   MCV 83.6 09/15/2019   PLT 300 09/15/2019   Lab Results  Component Value Date   ALT 15 09/15/2019   AST 17 09/15/2019   ALKPHOS 81 09/15/2019   BILITOT 1.6 (H) 09/15/2019     Review of Systems  Musculoskeletal:  Positive for arthralgias (left ring finger pain and swelling).   Patient Active Problem List   Diagnosis Date Noted   Current moderate episode of major depressive disorder without prior episode (HCC) 10/19/2020   Irritable bowel syndrome 04/20/2018   Mouth ulcers 08/21/2016   Rotator cuff syndrome of left shoulder 04/25/2015   Underweight 04/25/2015    No Known Allergies  Past Surgical History:  Procedure Laterality Date   WISDOM TOOTH EXTRACTION      Social History   Tobacco Use   Smoking status: Never   Smokeless tobacco: Never  Vaping Use   Vaping Use: Never used  Substance Use Topics   Alcohol use: Yes    Alcohol/week: 0.0 standard drinks   Drug use: No     Medication list has been reviewed and updated.  Current Meds  Medication Sig   acetaminophen (TYLENOL) 325 MG  tablet Take 500 mg by mouth every 6 (six) hours as needed.    albuterol (PROVENTIL HFA;VENTOLIN HFA) 108 (90 Base) MCG/ACT inhaler Inhale 2 puffs into the lungs every 6 (six) hours as needed for wheezing or shortness of breath.   dicyclomine (BENTYL) 10 MG capsule Take 1 capsule (10 mg total) by mouth 4 (four) times daily as needed for spasms.   escitalopram (LEXAPRO) 10 MG tablet Take 1 tablet (10 mg total) by mouth daily.   magic mouthwash w/lidocaine SOLN Take 5 mLs by mouth 4 (four) times daily. nystatin/benedryl/hydrocortisone + lidocaine 30% by volume   Multiple Vitamin (MULTIVITAMIN) capsule Take 1 capsule by mouth daily.    PHQ 2/9 Scores 07/16/2021 10/19/2020 07/19/2020 04/03/2020  PHQ - 2 Score 0 2 0 0  PHQ- 9 Score 0 17 0 2    GAD 7 : Generalized Anxiety Score 07/16/2021 10/19/2020 07/19/2020 04/03/2020  Nervous, Anxious, on Edge 0 3 0 0  Control/stop worrying 0 3 0 0  Worry too much - different things 0 3 0 0  Trouble relaxing 0 3 0 0  Restless 0 3 0 0  Easily annoyed or irritable 0 2 0 0  Afraid - awful might happen 0 2 0 0  Total GAD 7 Score 0 19 0 0  Anxiety Difficulty Not  difficult at all Very difficult Not difficult at all Not difficult at all    BP Readings from Last 3 Encounters:  07/16/21 130/78  10/19/20 124/78  07/19/20 114/76    Physical Exam Constitutional:      Appearance: Normal appearance.  Pulmonary:     Effort: Pulmonary effort is normal.  Musculoskeletal:     Left hand: Bony tenderness (over 4th PIP joint, able to flex 80% with moderate pain) present.  Neurological:     General: No focal deficit present.     Mental Status: He is alert.    Wt Readings from Last 3 Encounters:  07/16/21 174 lb (78.9 kg)  10/19/20 159 lb (72.1 kg)  07/19/20 162 lb (73.5 kg)    BP 130/78   Pulse 82   Ht 6' (1.829 m)   Wt 174 lb (78.9 kg)   SpO2 98%   BMI 23.60 kg/m   Assessment and Plan: 1. Pain involving joint of finger of left hand Continue buddy taping  for now. If fractured, will need to refer to Ortho (and this may be a WC case) - DG Finger Ring Left   Partially dictated using Animal nutritionist. Any errors are unintentional.  [name], MD [facility]  07/16/2021

## 2021-12-16 ENCOUNTER — Ambulatory Visit (INDEPENDENT_AMBULATORY_CARE_PROVIDER_SITE_OTHER): Payer: BC Managed Care – PPO | Admitting: Internal Medicine

## 2021-12-16 ENCOUNTER — Encounter: Payer: Self-pay | Admitting: Internal Medicine

## 2021-12-16 VITALS — BP 122/70 | HR 75 | Temp 98.0°F | Ht 72.0 in | Wt 162.4 lb

## 2021-12-16 DIAGNOSIS — J0100 Acute maxillary sinusitis, unspecified: Secondary | ICD-10-CM | POA: Diagnosis not present

## 2021-12-16 DIAGNOSIS — H10022 Other mucopurulent conjunctivitis, left eye: Secondary | ICD-10-CM | POA: Diagnosis not present

## 2021-12-16 MED ORDER — NEOMYCIN-POLYMYXIN-DEXAMETH 3.5-10000-0.1 OP SUSP: 2.0000 [drp] | Freq: Four times a day (QID) | OPHTHALMIC | 0 refills | Status: AC

## 2021-12-16 MED ORDER — AZITHROMYCIN 250 MG PO TABS: ORAL_TABLET | ORAL | 0 refills | Status: AC

## 2021-12-16 NOTE — Progress Notes (Signed)
? ? ? ?Date:  12/16/2021  ? ?[facility]  [facility]   DOB:  Apr 25, 1986   MRN:  [id number] ? ? ?Chief Complaint: Conjunctivitis and Sore Throat ? ?Sore Throat  ?This is a new problem. The current episode started in the past 7 days. Neither side of throat is experiencing more pain than the other. There has been no fever. Associated symptoms include congestion, swollen glands and trouble swallowing. Pertinent negatives include no ear discharge, ear pain, headaches or shortness of breath. He has had no exposure to strep. He has tried cool liquids and acetaminophen for the symptoms. The treatment provided no relief.  ?Conjunctivitis  ?The current episode started 5 to 7 days ago. The onset was gradual. The problem occurs continuously. The problem has been unchanged. The problem is moderate. Nothing relieves the symptoms. Associated symptoms include eye itching, congestion, sore throat, swollen glands, eye discharge and eye redness. Pertinent negatives include no fever, no ear discharge, no ear pain, no headaches and no wheezing.  ? ?Lab Results  ?Component Value Date  ? NA 138 09/15/2019  ? K 4.2 09/15/2019  ? CO2 21 (L) 09/15/2019  ? GLUCOSE 100 (H) 09/15/2019  ? BUN 21 (H) 09/15/2019  ? CREATININE 0.96 09/15/2019  ? CALCIUM 9.4 09/15/2019  ? GFRNONAA >60 09/15/2019  ? ?No results found for: CHOL, HDL, LDLCALC, LDLDIRECT, TRIG, CHOLHDL ?Lab Results  ?Component Value Date  ? TSH 1.180 09/16/2017  ? ?No results found for: HGBA1C ?Lab Results  ?Component Value Date  ? WBC 12.0 (H) 09/15/2019  ? HGB 16.9 09/15/2019  ? HCT 47.9 09/15/2019  ? MCV 83.6 09/15/2019  ? PLT 300 09/15/2019  ? ?Lab Results  ?Component Value Date  ? ALT 15 09/15/2019  ? AST 17 09/15/2019  ? ALKPHOS 81 09/15/2019  ? BILITOT 1.6 (H) 09/15/2019  ? ?No results found for: 25OHVITD2, 25OHVITD3, VD25OH  ? ?Review of Systems  ?Constitutional:  Negative for chills, fatigue and fever.  ?HENT:  Positive for congestion, sinus pressure, sore throat and  trouble swallowing. Negative for ear discharge, ear pain and postnasal drip.   ?Eyes:  Positive for discharge, redness, itching and visual disturbance.  ?Respiratory:  Negative for chest tightness, shortness of breath and wheezing.   ?Cardiovascular:  Negative for chest pain.  ?Neurological:  Negative for dizziness and headaches.  ? ?Patient Active Problem List  ? Diagnosis Date Noted  ? Current moderate episode of major depressive disorder without prior episode (HCC) 10/19/2020  ? Irritable bowel syndrome 04/20/2018  ? Mouth ulcers 08/21/2016  ? Rotator cuff syndrome of left shoulder 04/25/2015  ? Underweight 04/25/2015  ? ? ?No Known Allergies ? ?Past Surgical History:  ?Procedure Laterality Date  ? WISDOM TOOTH EXTRACTION    ? ? ?Social History  ? ?Tobacco Use  ? Smoking status: Never  ? Smokeless tobacco: Never  ?Vaping Use  ? Vaping Use: Never used  ?Substance Use Topics  ? Alcohol use: Yes  ?  Alcohol/week: 0.0 standard drinks  ? Drug use: No  ? ? ? ?Medication list has been reviewed and updated. ? ?Current Meds  ?Medication Sig  ? acetaminophen (TYLENOL) 325 MG tablet Take 500 mg by mouth every 6 (six) hours as needed.   ? albuterol (PROVENTIL HFA;VENTOLIN HFA) 108 (90 Base) MCG/ACT inhaler Inhale 2 puffs into the lungs every 6 (six) hours as needed for wheezing or shortness of breath.  ? azithromycin (ZITHROMAX Z-PAK) 250 MG tablet UAD  ? dicyclomine (BENTYL) 10 MG  capsule Take 1 capsule (10 mg total) by mouth 4 (four) times daily as needed for spasms.  ? escitalopram (LEXAPRO) 10 MG tablet Take 1 tablet (10 mg total) by mouth daily.  ? Multiple Vitamin (MULTIVITAMIN) capsule Take 1 capsule by mouth daily.  ? neomycin-polymyxin b-dexamethasone (MAXITROL) 3.5-10000-0.1 SUSP Place 2 drops into both eyes every 6 (six) hours for 10 days.  ? ? ? ?  12/16/2021  ? 10:23 AM 07/16/2021  ? 11:22 AM 10/19/2020  ? 11:51 AM 07/19/2020  ? 10:43 AM  ?GAD 7 : Generalized Anxiety Score  ?Nervous, Anxious, on Edge 0 0 3 0   ?Control/stop worrying 0 0 3 0  ?Worry too much - different things 0 0 3 0  ?Trouble relaxing 0 0 3 0  ?Restless 0 0 3 0  ?Easily annoyed or irritable 0 0 2 0  ?Afraid - awful might happen 0 0 2 0  ?Total GAD 7 Score 0 0 19 0  ?Anxiety Difficulty Not difficult at all Not difficult at all Very difficult Not difficult at all  ? ? ? ?  12/16/2021  ? 10:23 AM  ?Depression screen PHQ 2/9  ?Decreased Interest 0  ?Down, Depressed, Hopeless 0  ?PHQ - 2 Score 0  ?Altered sleeping 0  ?Tired, decreased energy 0  ?Change in appetite 0  ?Feeling bad or failure about yourself  0  ?Trouble concentrating 0  ?Moving slowly or fidgety/restless 0  ?Suicidal thoughts 0  ?PHQ-9 Score 0  ?Difficult doing work/chores Not difficult at all  ? ? ?BP Readings from Last 3 Encounters:  ?12/16/21 122/70  ?07/16/21 130/78  ?10/19/20 124/78  ? ? ?Physical Exam ?Vitals and nursing note reviewed.  ?Constitutional:   ?   General: He is not in acute distress. ?   Appearance: He is well-developed.  ?HENT:  ?   Head: Normocephalic and atraumatic.  ?   Nose:  ?   Right Sinus: No maxillary sinus tenderness or frontal sinus tenderness.  ?   Left Sinus: No maxillary sinus tenderness or frontal sinus tenderness.  ?   Mouth/Throat:  ?   Pharynx: No oropharyngeal exudate or posterior oropharyngeal erythema.  ?   Tonsils: No tonsillar exudate.  ?Eyes:  ?   General: Lids are normal.  ?   Extraocular Movements: Extraocular movements intact.  ?   Conjunctiva/sclera:  ?   Left eye: Chemosis and exudate present.  ?Cardiovascular:  ?   Rate and Rhythm: Normal rate and regular rhythm.  ?Pulmonary:  ?   Effort: Pulmonary effort is normal. No respiratory distress.  ?Musculoskeletal:  ?   Cervical back: Normal range of motion.  ?Skin: ?   General: Skin is warm and dry.  ?   Findings: No rash.  ?Neurological:  ?   Mental Status: He is alert and oriented to person, place, and time.  ?Psychiatric:     ?   Mood and Affect: Mood normal.     ?   Behavior: Behavior normal.   ? ? ?Wt Readings from Last 3 Encounters:  ?12/16/21 162 lb 6.4 oz (73.7 kg)  ?07/16/21 174 lb (78.9 kg)  ?10/19/20 159 lb (72.1 kg)  ? ? ?BP 122/70   Pulse 75   Temp 98 ?F (36.7 ?C) (Oral)   Ht 6' (1.829 m)   Wt 162 lb 6.4 oz (73.7 kg)   SpO2 97%   BMI 22.03 kg/m?  ? ?Assessment and Plan: ?1. Other mucopurulent conjunctivitis of left eye ?Warm compresses  if needed ?- neomycin-polymyxin b-dexamethasone (MAXITROL) 3.5-10000-0.1 SUSP; Place 2 drops into both eyes every 6 (six) hours for 10 days.  Dispense: 5 mL; Refill: 0 ? ?2. Subacute maxillary sinusitis ?Continue Claritin daily ?- azithromycin (ZITHROMAX Z-PAK) 250 MG tablet; UAD  Dispense: 6 each; Refill: 0 ? ? ?Partially dictated using Animal nutritionist. Any errors are unintentional. ? ?[name], MD ?[facility] ?[facility] ? ?12/16/2021 ? ? ? ? ? ?

## 2022-04-16 ENCOUNTER — Telehealth: Payer: Self-pay | Admitting: Internal Medicine

## 2022-04-16 NOTE — Telephone Encounter (Signed)
Called pt let him know that CPEs are booked out for at least a month. Pt verbalized understanding and booked an appt for 9/13 and wants [place] to call if sooner appt becomes available.  KP

## 2022-04-16 NOTE — Telephone Encounter (Signed)
Copied from CRM [id number]. Topic: Appointment Scheduling - Scheduling Inquiry for Clinic >> Apr 16, 2022 12:56 PM Nathan Guerrero wrote: Reason for CRM: Pt called to schedule appt for annual physical but declined first available appt due to not having a sooner appt. Pt requests that Chassidy return his call asap to possibly get him in sooner because he needs a physical before school begins. Cb# [phone]

## 2022-05-19 ENCOUNTER — Telehealth: Payer: Self-pay | Admitting: Internal Medicine

## 2022-05-19 NOTE — Telephone Encounter (Signed)
Copied from CRM [id number]. Topic: Appointment Scheduling - Prior Auth Required for Appointment >> May 19, 2022 10:25 AM Franchot Heidelberg wrote: Reason for CRM: Pt called requesting to reschedule his CPE for later, he declined the next available and wants a call back from nurse   Best contact: [phone]

## 2022-05-19 NOTE — Telephone Encounter (Signed)
Called pt he wants to keep the appt for 05/21/22.  KP

## 2022-05-21 ENCOUNTER — Encounter: Payer: BC Managed Care – PPO | Admitting: Internal Medicine

## 2022-05-21 NOTE — Progress Notes (Deleted)
Date:  05/21/2022   Name:  Nathan Guerrero [facility]   DOB:  [birth year]   MRN:  [id number]   Chief Complaint: No chief complaint on file. Nathan Guerrero is a 36 y.o. male who presents today for his Complete Annual Exam. He feels {DESC; WELL/FAIRLY WELL/POORLY:18703}. He reports exercising ***. He reports he is sleeping {DESC; WELL/FAIRLY WELL/POORLY:18703}.     Immunization History  Administered Date(s) Administered   Meningococcal Conjugate 02/11/2008   PFIZER(Purple Top)SARS-COV-2 Vaccination 11/21/2019, 12/12/2019   Health Maintenance Due  Topic Date Due   HIV Screening  Never done   Hepatitis C Screening  Never done   TETANUS/TDAP  Never done   COVID-19 Vaccine (3 - Pfizer series) 02/06/2020   INFLUENZA VACCINE  Never done    No results found for: "PSA1", "PSA"   Depression        This is a chronic problem.The problem is unchanged.  Associated symptoms include no fatigue, no appetite change, no myalgias and no headaches.  Past treatments include SSRIs - Selective serotonin reuptake inhibitors.   Lab Results  Component Value Date   NA 138 09/15/2019   K 4.2 09/15/2019   CO2 21 (L) 09/15/2019   GLUCOSE 100 (H) 09/15/2019   BUN 21 (H) 09/15/2019   CREATININE 0.96 09/15/2019   CALCIUM 9.4 09/15/2019   GFRNONAA >60 09/15/2019   No results found for: "CHOL", "HDL", "LDLCALC", "LDLDIRECT", "TRIG", "CHOLHDL" Lab Results  Component Value Date   TSH 1.180 09/16/2017   No results found for: "HGBA1C" Lab Results  Component Value Date   WBC 12.0 (H) 09/15/2019   HGB 16.9 09/15/2019   HCT 47.9 09/15/2019   MCV 83.6 09/15/2019   PLT 300 09/15/2019   Lab Results  Component Value Date   ALT 15 09/15/2019   AST 17 09/15/2019   ALKPHOS 81 09/15/2019   BILITOT 1.6 (H) 09/15/2019   No results found for: "25OHVITD2", "25OHVITD3", "VD25OH"   Review of Systems  Constitutional:  Negative for appetite change, chills, diaphoresis, fatigue and unexpected weight  change.  HENT:  Negative for hearing loss, tinnitus, trouble swallowing and voice change.   Eyes:  Negative for visual disturbance.  Respiratory:  Negative for choking, shortness of breath and wheezing.   Cardiovascular:  Negative for chest pain, palpitations and leg swelling.  Gastrointestinal:  Negative for abdominal pain, blood in stool, constipation and diarrhea.  Genitourinary:  Negative for difficulty urinating, dysuria and frequency.  Musculoskeletal:  Negative for arthralgias, back pain and myalgias.  Skin:  Negative for color change and rash.  Neurological:  Negative for dizziness, syncope and headaches.  Hematological:  Negative for adenopathy.  Psychiatric/Behavioral:  Positive for depression. Negative for dysphoric mood and sleep disturbance. The patient is not nervous/anxious.     Patient Active Problem List   Diagnosis Date Noted   Current moderate episode of major depressive disorder without prior episode ([place]) 10/19/2020   Irritable bowel syndrome 04/20/2018   Mouth ulcers 08/21/2016   Rotator cuff syndrome of left shoulder 04/25/2015   Underweight 04/25/2015    No Known Allergies  Past Surgical History:  Procedure Laterality Date   WISDOM TOOTH EXTRACTION      Social History   Tobacco Use   Smoking status: Never   Smokeless tobacco: Never  Vaping Use   Vaping Use: Never used  Substance Use Topics   Alcohol use: Yes    Alcohol/week: 0.0 standard drinks of alcohol   Drug use: No  Medication list has been reviewed and updated.  No outpatient medications have been marked as taking for the 05/21/22 encounter (Appointment) with Reubin Milan, MD.       12/16/2021   10:23 AM 07/16/2021   11:22 AM 10/19/2020   11:51 AM 07/19/2020   10:43 AM  GAD 7 : Generalized Anxiety Score  Nervous, Anxious, on Edge 0 0 3 0  Control/stop worrying 0 0 3 0  Worry too much - different things 0 0 3 0  Trouble relaxing 0 0 3 0  Restless 0 0 3 0  Easily annoyed or  irritable 0 0 2 0  Afraid - awful might happen 0 0 2 0  Total GAD 7 Score 0 0 19 0  Anxiety Difficulty Not difficult at all Not difficult at all Very difficult Not difficult at all       12/16/2021   10:23 AM 07/16/2021   11:22 AM 10/19/2020   11:44 AM  Depression screen PHQ 2/9  Decreased Interest 0 0 1  Down, Depressed, Hopeless 0 0 1  PHQ - 2 Score 0 0 2  Altered sleeping 0 0 3  Tired, decreased energy 0 0 2  Change in appetite 0 0 2  Feeling bad or failure about yourself  0 0 3  Trouble concentrating 0 0 2  Moving slowly or fidgety/restless 0 0 3  Suicidal thoughts 0 0 0  PHQ-9 Score 0 0 17  Difficult doing work/chores Not difficult at all Not difficult at all Very difficult    BP Readings from Last 3 Encounters:  12/16/21 122/70  07/16/21 130/78  10/19/20 124/78    Physical Exam Vitals and nursing note reviewed.  Constitutional:      Appearance: Normal appearance. He is well-developed.  HENT:     Head: Normocephalic.     Right Ear: Tympanic membrane, ear canal and external ear normal.     Left Ear: Tympanic membrane, ear canal and external ear normal.     Nose: Nose normal.  Eyes:     Conjunctiva/sclera: Conjunctivae normal.     Pupils: Pupils are equal, round, and reactive to light.  Neck:     Thyroid: No thyromegaly.     Vascular: No carotid bruit.  Cardiovascular:     Rate and Rhythm: Normal rate and regular rhythm.     Heart sounds: Normal heart sounds.  Pulmonary:     Effort: Pulmonary effort is normal.     Breath sounds: Normal breath sounds. No wheezing.  Chest:  Breasts:    Right: No mass.     Left: No mass.  Abdominal:     General: Bowel sounds are normal.     Palpations: Abdomen is soft.     Tenderness: There is no abdominal tenderness.  Musculoskeletal:        General: Normal range of motion.     Cervical back: Normal range of motion and neck supple.  Lymphadenopathy:     Cervical: No cervical adenopathy.  Skin:    General: Skin is warm  and dry.  Neurological:     Mental Status: He is alert and oriented to person, place, and time.     Deep Tendon Reflexes: Reflexes are normal and symmetric.  Psychiatric:        Attention and Perception: Attention normal.        Mood and Affect: Mood normal.        Thought Content: Thought content normal.     Wt Readings  from Last 3 Encounters:  12/16/21 162 lb 6.4 oz (73.7 kg)  07/16/21 174 lb (78.9 kg)  10/19/20 159 lb (72.1 kg)    There were no vitals taken for this visit.  Assessment and Plan:

## 2022-05-26 ENCOUNTER — Encounter: Payer: Self-pay | Admitting: Internal Medicine

## 2022-06-04 ENCOUNTER — Encounter: Payer: Self-pay | Admitting: Internal Medicine

## 2022-06-04 ENCOUNTER — Ambulatory Visit (INDEPENDENT_AMBULATORY_CARE_PROVIDER_SITE_OTHER): Payer: BC Managed Care – PPO | Admitting: Internal Medicine

## 2022-06-04 VITALS — BP 100/76 | HR 64 | Ht 72.0 in | Wt 175.0 lb

## 2022-06-04 DIAGNOSIS — Z1159 Encounter for screening for other viral diseases: Secondary | ICD-10-CM | POA: Diagnosis not present

## 2022-06-04 DIAGNOSIS — K589 Irritable bowel syndrome without diarrhea: Secondary | ICD-10-CM

## 2022-06-04 DIAGNOSIS — Z23 Encounter for immunization: Secondary | ICD-10-CM | POA: Diagnosis not present

## 2022-06-04 DIAGNOSIS — Z13220 Encounter for screening for lipoid disorders: Secondary | ICD-10-CM | POA: Diagnosis not present

## 2022-06-04 DIAGNOSIS — S83279A Complex tear of lateral meniscus, current injury, unspecified knee, initial encounter: Secondary | ICD-10-CM | POA: Insufficient documentation

## 2022-06-04 DIAGNOSIS — Z0000 Encounter for general adult medical examination without abnormal findings: Secondary | ICD-10-CM

## 2022-06-04 DIAGNOSIS — F321 Major depressive disorder, single episode, moderate: Secondary | ICD-10-CM

## 2022-06-04 MED ORDER — DICYCLOMINE HCL 10 MG PO CAPS: 10.0000 mg | ORAL_CAPSULE | Freq: Four times a day (QID) | ORAL | 0 refills | Status: DC | PRN

## 2022-06-04 NOTE — Patient Instructions (Signed)
06/03/2022 10:13 AM EDT    1.  Complex tear of lateral meniscus anterior horn/body junction including high-grade radial component and mildly displaced inferior flap as described. No extrusion. 2.   Possible grade 1 sprain of anterior cruciate ligament. No fiber discontinuity or laxity. 3.  Low-grade strain versus mild tendinopathy of medial gastrocnemius origin. 4.  Small joint effusion. 5.  Edema along distal IT band, which may be reactive or related to distal IT band syndrome.  6.  Low-grade chondromalacia patellae.  Signed (Electronic Signature): 06/03/2022 10:13 AM  Signed By: Teena Irani, MD

## 2022-06-04 NOTE — Progress Notes (Signed)
Date:  06/04/2022   Name:  Nathan Guerrero [facility]   DOB:  [birth year]   MRN:  [id number]   Chief Complaint: Annual Exam Nathan Guerrero is a 36 y.o. male who presents today for his Complete Annual Exam. He feels well. He reports exercising none due to left knee. He reports he is sleeping poorly.  His mood is much better than last year - he never did take the Lexapro.  Colonoscopy: none  Immunization History  Administered Date(s) Administered   Meningococcal Conjugate 02/11/2008   PFIZER(Purple Top)SARS-COV-2 Vaccination 11/21/2019, 12/12/2019   Health Maintenance Due  Topic Date Due   Hepatitis C Screening  Never done   TETANUS/TDAP  Never done    No results found for: "PSA1", "PSA"   Depression        This is a chronic problem.  The problem has been resolved since onset.  Associated symptoms include no fatigue, no appetite change, no myalgias and no headaches.  Past treatments include SSRIs - Selective serotonin reuptake inhibitors (no longer taking meds).  Compliance with treatment is good.  Previous treatment provided significant relief. Abdominal Pain This is a recurrent (due to IBS) problem. The pain is located in the generalized abdominal region. The quality of the pain is cramping. Pertinent negatives include no arthralgias, constipation, diarrhea, dysuria, frequency, headaches or myalgias. Treatments tried: Bentyl.  Knee Pain  The incident occurred at work. The injury mechanism was a twisting injury. The pain is present in the left knee. The pain is mild. The pain has been Improving since onset. He has tried ice and NSAIDs (mobic upset his stomach.  MRI yesterday - consult tomorrow) for the symptoms.    Lab Results  Component Value Date   NA 138 09/15/2019   K 4.2 09/15/2019   CO2 21 (L) 09/15/2019   GLUCOSE 100 (H) 09/15/2019   BUN 21 (H) 09/15/2019   CREATININE 0.96 09/15/2019   CALCIUM 9.4 09/15/2019   GFRNONAA >60 09/15/2019   No results found for:  "CHOL", "HDL", "LDLCALC", "LDLDIRECT", "TRIG", "CHOLHDL" Lab Results  Component Value Date   TSH 1.180 09/16/2017   No results found for: "HGBA1C" Lab Results  Component Value Date   WBC 12.0 (H) 09/15/2019   HGB 16.9 09/15/2019   HCT 47.9 09/15/2019   MCV 83.6 09/15/2019   PLT 300 09/15/2019   Lab Results  Component Value Date   ALT 15 09/15/2019   AST 17 09/15/2019   ALKPHOS 81 09/15/2019   BILITOT 1.6 (H) 09/15/2019   No results found for: "25OHVITD2", "25OHVITD3", "VD25OH"   Review of Systems  Constitutional:  Negative for appetite change, chills, diaphoresis, fatigue and unexpected weight change.  HENT:  Negative for hearing loss, tinnitus, trouble swallowing and voice change.   Eyes:  Negative for visual disturbance.  Respiratory:  Negative for choking, shortness of breath and wheezing.   Cardiovascular:  Negative for chest pain, palpitations and leg swelling.  Gastrointestinal:  Positive for abdominal pain (intermittent IBS symptoms with certain foods, stress). Negative for blood in stool, constipation and diarrhea.  Genitourinary:  Negative for difficulty urinating, dysuria and frequency.  Musculoskeletal:  Positive for gait problem (left knee pain). Negative for arthralgias, back pain and myalgias.  Skin:  Negative for color change and rash.  Neurological:  Negative for dizziness, syncope and headaches.  Hematological:  Negative for adenopathy.  Psychiatric/Behavioral:  Positive for depression and sleep disturbance. Negative for dysphoric mood. The patient is not nervous/anxious.  Patient Active Problem List   Diagnosis Date Noted   Complex tear of lateral meniscus of knee 06/04/2022   Current moderate episode of major depressive disorder without prior episode ([place]) 10/19/2020   Irritable bowel syndrome 04/20/2018   Mouth ulcers 08/21/2016   Rotator cuff syndrome of left shoulder 04/25/2015   Underweight 04/25/2015    No Known Allergies  Past Surgical  History:  Procedure Laterality Date   WISDOM TOOTH EXTRACTION      Social History   Tobacco Use   Smoking status: Never   Smokeless tobacco: Never  Vaping Use   Vaping Use: Never used  Substance Use Topics   Alcohol use: Yes    Alcohol/week: 0.0 standard drinks of alcohol   Drug use: No     Medication list has been reviewed and updated.  Current Meds  Medication Sig   acetaminophen (TYLENOL) 325 MG tablet Take 500 mg by mouth every 6 (six) hours as needed.    albuterol (PROVENTIL HFA;VENTOLIN HFA) 108 (90 Base) MCG/ACT inhaler Inhale 2 puffs into the lungs every 6 (six) hours as needed for wheezing or shortness of breath.   Multiple Vitamin (MULTIVITAMIN) capsule Take 1 capsule by mouth daily.       12/16/2021   10:23 AM 07/16/2021   11:22 AM 10/19/2020   11:51 AM 07/19/2020   10:43 AM  GAD 7 : Generalized Anxiety Score  Nervous, Anxious, on Edge 0 0 3 0  Control/stop worrying 0 0 3 0  Worry too much - different things 0 0 3 0  Trouble relaxing 0 0 3 0  Restless 0 0 3 0  Easily annoyed or irritable 0 0 2 0  Afraid - awful might happen 0 0 2 0  Total GAD 7 Score 0 0 19 0  Anxiety Difficulty Not difficult at all Not difficult at all Very difficult Not difficult at all       06/04/2022    9:16 AM 12/16/2021   10:23 AM 07/16/2021   11:22 AM  Depression screen PHQ 2/9  Decreased Interest 0 0 0  Down, Depressed, Hopeless 0 0 0  PHQ - 2 Score 0 0 0  Altered sleeping 3 0 0  Tired, decreased energy 2 0 0  Change in appetite 0 0 0  Feeling bad or failure about yourself  0 0 0  Trouble concentrating 0 0 0  Moving slowly or fidgety/restless 0 0 0  Suicidal thoughts 0 0 0  PHQ-9 Score 5 0 0  Difficult doing work/chores Not difficult at all Not difficult at all Not difficult at all    BP Readings from Last 3 Encounters:  06/04/22 100/76  12/16/21 122/70  07/16/21 130/78    Physical Exam Vitals and nursing note reviewed.  Constitutional:      Appearance: Normal  appearance. He is well-developed.  HENT:     Head: Normocephalic.     Right Ear: Tympanic membrane, ear canal and external ear normal.     Left Ear: Tympanic membrane, ear canal and external ear normal.     Nose: Nose normal.  Eyes:     Conjunctiva/sclera: Conjunctivae normal.     Pupils: Pupils are equal, round, and reactive to light.  Neck:     Thyroid: No thyromegaly.     Vascular: No carotid bruit.  Cardiovascular:     Rate and Rhythm: Normal rate and regular rhythm.     Heart sounds: Normal heart sounds.  Pulmonary:     Effort:  Pulmonary effort is normal.     Breath sounds: Normal breath sounds. No wheezing.  Chest:  Breasts:    Right: No mass.     Left: No mass.  Abdominal:     General: Bowel sounds are normal.     Palpations: Abdomen is soft.     Tenderness: There is no abdominal tenderness.  Musculoskeletal:        General: Signs of injury (left knee brace) present. Normal range of motion.     Cervical back: Normal range of motion and neck supple.     Comments: Left knee brace - MRI shows meniscal tear, no ACL injury.  Lymphadenopathy:     Cervical: No cervical adenopathy.  Skin:    General: Skin is warm and dry.  Neurological:     Mental Status: He is alert and oriented to person, place, and time.     Deep Tendon Reflexes: Reflexes are normal and symmetric.  Psychiatric:        Attention and Perception: Attention normal.        Mood and Affect: Mood normal.        Thought Content: Thought content normal.     Wt Readings from Last 3 Encounters:  06/04/22 175 lb (79.4 kg)  12/16/21 162 lb 6.4 oz (73.7 kg)  07/16/21 174 lb (78.9 kg)    BP 100/76   Pulse 64   Ht 6' (1.829 m)   Wt 175 lb (79.4 kg)   SpO2 98%   BMI 23.73 kg/m   Assessment and Plan: 1. Annual physical exam Normal exam. Continue healthy diet, Declines flu vaccine - CBC with Differential/Platelet - Comprehensive metabolic panel - Lipid panel - Hepatitis C antibody - TSH  2.  Screening for lipid disorders - Lipid panel  3. 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 Would not recommend medications at this time.   Follow up if needed - TSH  4. Need for hepatitis C screening test - Hepatitis C antibody  5. Complex tear of lateral meniscus of knee, unspecified laterality, unspecified whether old or current tear, initial encounter Seeing Ortho tomorrow for further management.  6. Irritable bowel syndrome, unspecified type Symptoms unchanged - no worrisome findings or symptoms  - CBC with Differential/Platelet - Comprehensive metabolic panel - dicyclomine (BENTYL) 10 MG capsule; Take 1 capsule (10 mg total) by mouth 4 (four) times daily as needed for spasms.  Dispense: 120 capsule; Refill: 0  7. Need for diphtheria-tetanus-pertussis (Tdap) vaccine - Tdap vaccine greater than or equal to 7yo IM   Partially dictated using Editor, commissioning. Any errors are unintentional.  Halina Maidens, MD [facility]  06/04/2022

## 2022-06-05 LAB — CBC WITH DIFFERENTIAL/PLATELET
Basophils Absolute: 0.1 10*3/uL (ref 0.0–0.2)
Basos: 1 %
EOS (ABSOLUTE): 0.2 10*3/uL (ref 0.0–0.4)
Eos: 3 %
Hematocrit: 46.1 % (ref 37.5–51.0)
Hemoglobin: 16 g/dL (ref 13.0–17.7)
Immature Grans (Abs): 0 10*3/uL (ref 0.0–0.1)
Immature Granulocytes: 0 %
Lymphocytes Absolute: 1.8 10*3/uL (ref 0.7–3.1)
Lymphs: 26 %
MCH: 30.2 pg (ref 26.6–33.0)
MCHC: 34.7 g/dL (ref 31.5–35.7)
MCV: 87 fL (ref 79–97)
Monocytes Absolute: 0.6 10*3/uL (ref 0.1–0.9)
Monocytes: 9 %
Neutrophils Absolute: 4.4 10*3/uL (ref 1.4–7.0)
Neutrophils: 61 %
Platelets: 292 10*3/uL (ref 150–450)
RBC: 5.29 x10E6/uL (ref 4.14–5.80)
RDW: 12.3 % (ref 11.6–15.4)
WBC: 7.1 10*3/uL (ref 3.4–10.8)

## 2022-06-05 LAB — LIPID PANEL
Chol/HDL Ratio: 2 ratio (ref 0.0–5.0)
Cholesterol, Total: 156 mg/dL (ref 100–199)
HDL: 78 mg/dL (ref >39)
LDL Chol Calc (NIH): 66 mg/dL (ref 0–99)
Triglycerides: 59 mg/dL (ref 0–149)
VLDL Cholesterol Cal: 12 mg/dL (ref 5–40)

## 2022-06-05 LAB — COMPREHENSIVE METABOLIC PANEL
ALT: 12 IU/L (ref 0–44)
AST: 14 IU/L (ref 0–40)
Albumin/Globulin Ratio: 2.1 (ref 1.2–2.2)
Albumin: 5.1 g/dL (ref 4.1–5.1)
Alkaline Phosphatase: 108 IU/L (ref 44–121)
BUN/Creatinine Ratio: 18 (ref 9–20)
BUN: 19 mg/dL (ref 6–20)
Bilirubin Total: 0.9 mg/dL (ref 0.0–1.2)
CO2: 21 mmol/L (ref 20–29)
Calcium: 9.9 mg/dL (ref 8.7–10.2)
Chloride: 101 mmol/L (ref 96–106)
Creatinine, Ser: 1.03 mg/dL (ref 0.76–1.27)
Globulin, Total: 2.4 g/dL (ref 1.5–4.5)
Glucose: 100 mg/dL — ABNORMAL HIGH (ref 70–99)
Potassium: 4.8 mmol/L (ref 3.5–5.2)
Sodium: 138 mmol/L (ref 134–144)
Total Protein: 7.5 g/dL (ref 6.0–8.5)
eGFR: 95 mL/min/{1.73_m2} (ref >59)

## 2022-06-05 LAB — HEPATITIS C ANTIBODY: Hep C Virus Ab: NONREACTIVE

## 2022-06-05 LAB — TSH: TSH: 1.4 u[IU]/mL (ref 0.450–4.500)

## 2022-06-12 IMAGING — CR DG FINGER RING 2+V*L*
3 series · 3 of 3 positions shown · non-contrast
Comparison: None.

CLINICAL DATA: Trauma 5 days ago with pain and swelling at proximal
interphalangeal joint.

EXAM:
LEFT RING FINGER 2+V

[finger ap]
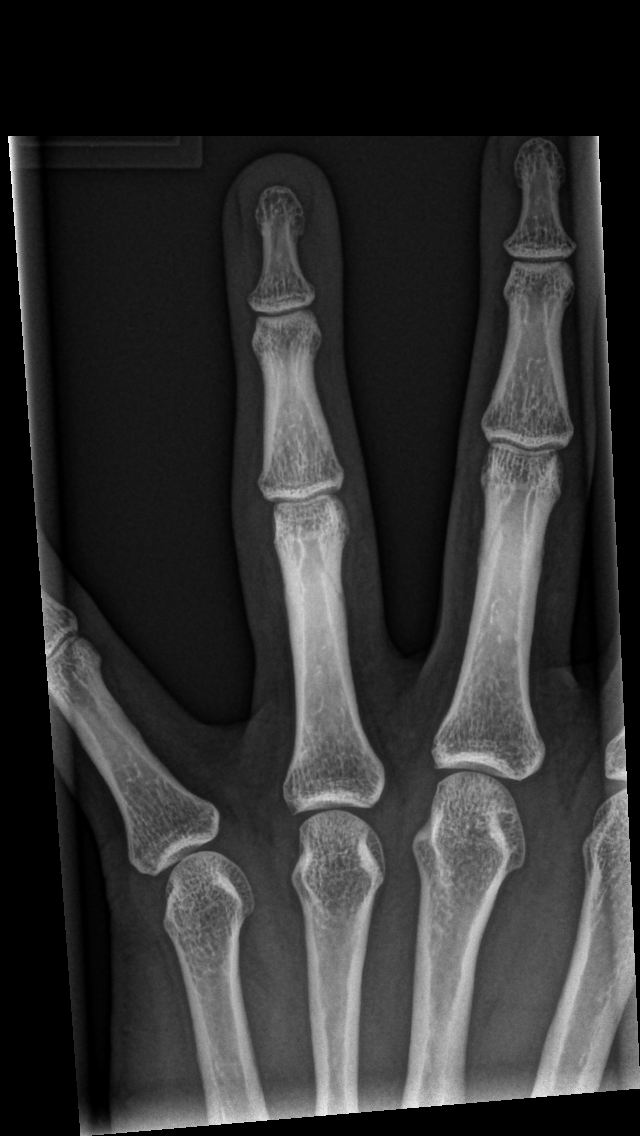

[finger obl]
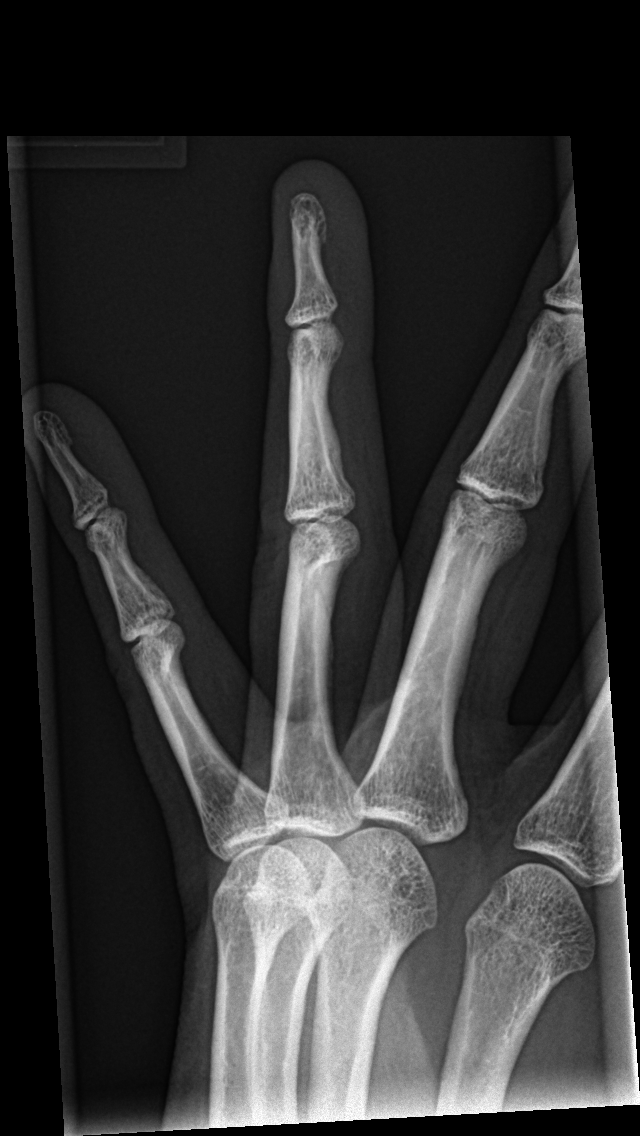

[finger lat]
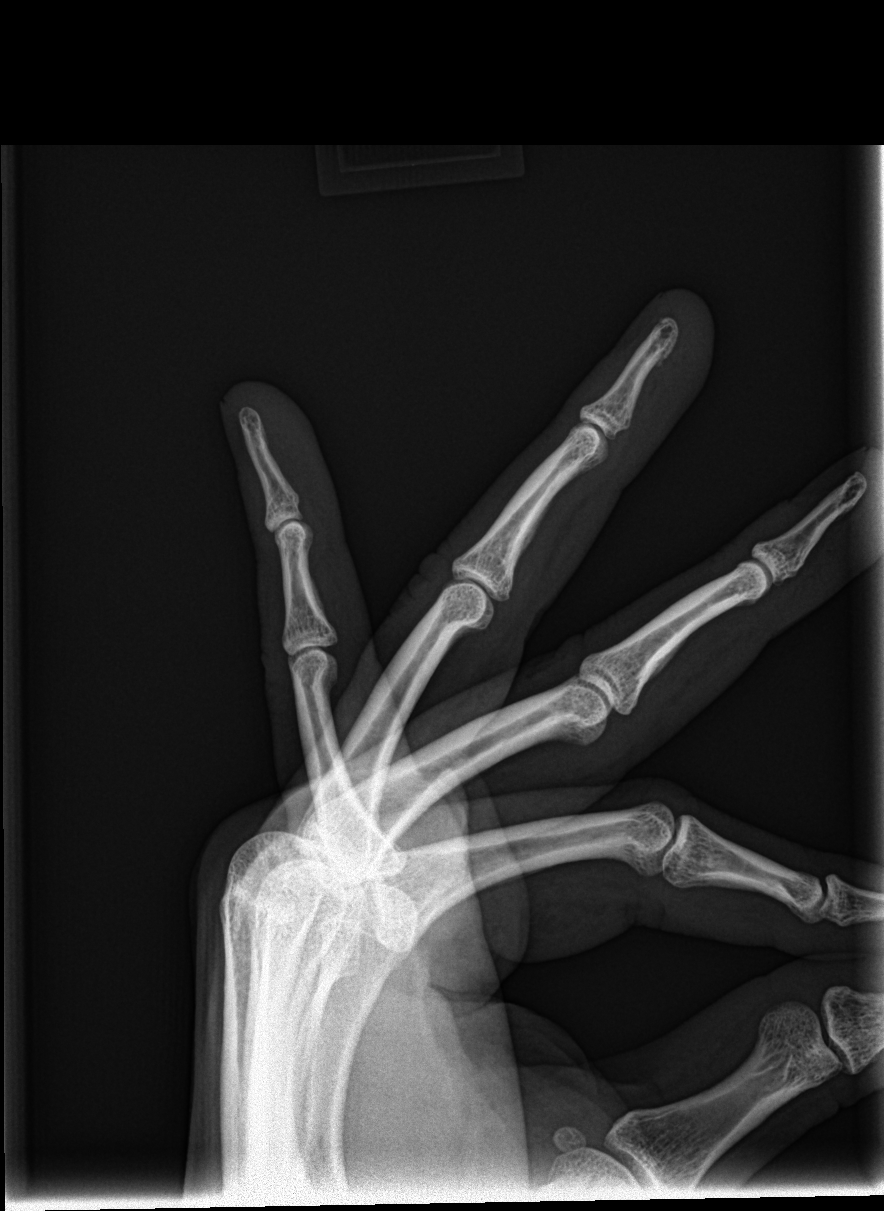

[3 of 3 positions shown; findings below may reference images not displayed]

FINDINGS: No acute fracture or dislocation. Lucency involving the distal
portion of the proximal phalanx of the ring finger on the AP view is
favored to represent a nutrient foramen. No correlate on the lateral
view. Minimal if any soft tissue swelling about the proximal
interphalangeal joint.
IMPRESSION: No acute osseous abnormality.

## 2022-07-14 ENCOUNTER — Other Ambulatory Visit: Payer: Self-pay | Admitting: Internal Medicine

## 2022-07-14 DIAGNOSIS — K589 Irritable bowel syndrome without diarrhea: Secondary | ICD-10-CM

## 2022-07-14 NOTE — Telephone Encounter (Signed)
Refilled 06/04/2022 #[id number] [id number] Requested Prescriptions  Pending Prescriptions Disp Refills   dicyclomine (BENTYL) 10 MG capsule [Pharmacy Med Name: DICYCLOMINE 10 MG CAPSULE] 120 capsule 0    Sig: TAKE 1 CAPSULE (10 MG TOTAL) BY MOUTH 4 (FOUR) TIMES DAILY AS NEEDED FOR SPASMS.     Gastroenterology:  Antispasmodic Agents Passed - 07/14/2022  1:54 AM      Passed - Valid encounter within last 12 months    Recent Outpatient Visits           1 month ago Annual physical exam   [place] [facility] at [facility], Jesse Sans, MD   7 months ago Other mucopurulent conjunctivitis of left eye   [place] [facility] at [facility], Jesse Sans, MD   12 months ago Pain involving joint of finger of left hand   [place] [facility] at [facility], Jesse Sans, MD   1 year ago Current moderate episode of major depressive disorder without prior episode [facility])   [place] [facility] at [facility], Jesse Sans, MD   1 year ago Pain of right hand   [facility] at [facility], Jesse Sans, MD       Future Appointments             In 11 months Army Melia Jesse Sans, MD [place] [facility] at [facility], [facility]

## 2022-08-11 ENCOUNTER — Ambulatory Visit: Payer: BC Managed Care – PPO | Admitting: Internal Medicine

## 2022-08-11 ENCOUNTER — Ambulatory Visit: Payer: Self-pay | Admitting: *Deleted

## 2022-08-11 ENCOUNTER — Encounter: Payer: Self-pay | Admitting: Internal Medicine

## 2022-08-11 VITALS — BP 110/70 | HR 108 | Temp 100.0°F | Ht 72.0 in | Wt 175.0 lb

## 2022-08-11 DIAGNOSIS — J101 Influenza due to other identified influenza virus with other respiratory manifestations: Secondary | ICD-10-CM

## 2022-08-11 DIAGNOSIS — J069 Acute upper respiratory infection, unspecified: Secondary | ICD-10-CM

## 2022-08-11 LAB — POCT INFLUENZA A/B
Influenza A, POC: POSITIVE — AB
Influenza B, POC: NEGATIVE

## 2022-08-11 LAB — POC COVID19 BINAXNOW: SARS Coronavirus 2 Ag: NEGATIVE

## 2022-08-11 MED ORDER — OSELTAMIVIR PHOSPHATE 75 MG PO CAPS: 75.0000 mg | ORAL_CAPSULE | Freq: Two times a day (BID) | ORAL | 0 refills | Status: DC

## 2022-08-11 MED ORDER — PROMETHAZINE-DM 6.25-15 MG/5ML PO SYRP: 5.0000 mL | ORAL_SOLUTION | Freq: Four times a day (QID) | ORAL | 0 refills | Status: DC | PRN

## 2022-08-11 NOTE — Progress Notes (Signed)
Date:  08/11/2022   Name:  Nathan Guerrero [facility]   DOB:  01-08-1986   MRN:  [id number]   Chief Complaint: Cough  Cough This is a new problem. The current episode started yesterday. The problem has been waxing and waning. The problem occurs constantly. The cough is Productive of sputum. Associated symptoms include chest pain, chills, ear pain, a fever, headaches, myalgias, postnasal drip, a sore throat, shortness of breath and wheezing.  URI  This is a new problem. The current episode started yesterday. The maximum temperature recorded prior to his arrival was 103 - 104 F. Associated symptoms include chest pain, coughing, ear pain, headaches, nausea, a sore throat and wheezing. Pertinent negatives include no diarrhea or vomiting. He has tried NSAIDs for the symptoms. The treatment provided no relief.    Lab Results  Component Value Date   NA 138 06/04/2022   K 4.8 06/04/2022   CO2 21 06/04/2022   GLUCOSE 100 (H) 06/04/2022   BUN 19 06/04/2022   CREATININE 1.03 06/04/2022   CALCIUM 9.9 06/04/2022   EGFR 95 06/04/2022   GFRNONAA >60 09/15/2019   Lab Results  Component Value Date   CHOL 156 06/04/2022   HDL 78 06/04/2022   LDLCALC 66 06/04/2022   TRIG 59 06/04/2022   CHOLHDL 2.0 06/04/2022   Lab Results  Component Value Date   TSH 1.400 06/04/2022   No results found for: "HGBA1C" Lab Results  Component Value Date   WBC 7.1 06/04/2022   HGB 16.0 06/04/2022   HCT 46.1 06/04/2022   MCV 87 06/04/2022   PLT 292 06/04/2022   Lab Results  Component Value Date   ALT 12 06/04/2022   AST 14 06/04/2022   ALKPHOS 108 06/04/2022   BILITOT 0.9 06/04/2022   No results found for: "25OHVITD2", "25OHVITD3", "VD25OH"   Review of Systems  Constitutional:  Positive for chills and fever.  HENT:  Positive for ear pain, postnasal drip and sore throat.   Respiratory:  Positive for cough, shortness of breath and wheezing.   Cardiovascular:  Positive for chest pain.   Gastrointestinal:  Positive for nausea. Negative for diarrhea and vomiting.  Musculoskeletal:  Positive for myalgias.  Neurological:  Positive for headaches.  Psychiatric/Behavioral:  Positive for sleep disturbance.     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  Current Meds  Medication Sig   acetaminophen (TYLENOL) 325 MG tablet Take 500 mg by mouth every 6 (six) hours as needed.    albuterol (PROVENTIL HFA;VENTOLIN HFA) 108 (90 Base) MCG/ACT inhaler Inhale 2 puffs into the lungs every 6 (six) hours as needed for wheezing or shortness of breath.   dicyclomine (BENTYL) 10 MG capsule Take 1 capsule (10 mg  total) by mouth 4 (four) times daily as needed for spasms.   meloxicam (MOBIC) 15 MG tablet Take 15 mg by mouth daily.   Multiple Vitamin (MULTIVITAMIN) capsule Take 1 capsule by mouth daily.       06/04/2022    9:17 AM 12/16/2021   10:23 AM 07/16/2021   11:22 AM 10/19/2020   11:51 AM  GAD 7 : Generalized Anxiety Score  Nervous, Anxious, on Edge 1 0 0 3  Control/stop worrying 1 0 0 3  Worry too much - different things 3 0 0 3  Trouble relaxing 2 0 0 3  Restless 1 0 0 3  Easily annoyed or irritable 1 0 0 2  Afraid - awful might happen 1 0 0 2  Total GAD 7 Score 10 0 0 19  Anxiety Difficulty Very  difficult Not difficult at all Not difficult at all Very difficult       06/04/2022    9:16 AM 12/16/2021   10:23 AM 07/16/2021   11:22 AM  Depression screen PHQ 2/9  Decreased Interest 0 0 0  Down, Depressed, Hopeless 0 0 0  PHQ - 2 Score 0 0 0  Altered sleeping 3 0 0  Tired, decreased energy 2 0 0  Change in appetite 0 0 0  Feeling bad or failure about yourself  0 0 0  Trouble concentrating 0 0 0  Moving slowly or fidgety/restless 0 0 0  Suicidal thoughts 0 0 0  PHQ-9 Score 5 0 0  Difficult doing work/chores Not difficult at all Not difficult at all Not difficult at all    BP Readings from Last 3 Encounters:  08/11/22 110/70  06/04/22 100/76  12/16/21 122/70    Physical Exam Vitals and nursing note reviewed.  Constitutional:      General: He is not in acute distress.    Appearance: He is well-developed. He is ill-appearing.  HENT:     Head: Normocephalic and atraumatic.     Right Ear: Tympanic membrane normal.     Left Ear: Tympanic membrane normal.     Mouth/Throat:     Pharynx: No oropharyngeal exudate or posterior oropharyngeal erythema.  Cardiovascular:     Rate and Rhythm: Regular rhythm. Tachycardia present.     Heart sounds: Normal heart sounds.  Pulmonary:     Effort: Pulmonary effort is normal. No respiratory distress.     Breath sounds: No wheezing or rhonchi.  Skin:    General: Skin is warm and dry.     Findings: No rash.  Neurological:     Mental Status: He is alert and oriented to person, place, and time.  Psychiatric:        Mood and Affect: Mood normal.        Behavior: Behavior normal.     Wt Readings from Last 3 Encounters:  08/11/22 175 lb (79.4 kg)  06/04/22 175 lb (79.4 kg)  12/16/21 162 lb 6.4 oz (73.7 kg)    BP 110/70   Pulse (!) 108   Temp 100 F (37.8 C) (Oral)   Ht 6' (1.829 m)   Wt 175 lb (79.4 kg)   SpO2 95%   BMI 23.73 kg/m   Assessment and Plan: 1. Influenza A Push fluids and alternate Advil with tylenol every  three hours. Diet as tolerated Take Tamiflu to shorten the course of illness Note to be out of work from today through Thursday - oseltamivir (TAMIFLU) 75 MG capsule; Take 1 capsule (75 mg total)  by mouth 2 (two) times daily.  Dispense: 10 capsule; Refill: 0 - promethazine-dextromethorphan (PROMETHAZINE-DM) 6.25-15 MG/5ML syrup; Take 5 mLs by mouth 4 (four) times daily as needed for up to 9 days for cough.  Dispense: 118 mL; Refill: 0  2. Upper respiratory tract infection, unspecified type Due to influenza A - POCT Influenza A/B - + A - POC COVID-19 BinaxNow - negative   Partially dictated using Dragon software. Any errors are unintentional.  Laura Berglund, MD [facility]  08/11/2022      

## 2022-08-11 NOTE — Telephone Encounter (Signed)
Reason for Disposition  Fever > 104 F (40 C)    Fever 103 last night  101.5 this morning  Protocols used: Fever-A-AH

## 2022-08-11 NOTE — Patient Instructions (Signed)
Push fluids - alternate water with something with sugar.  Alternate Tylenol 1000 mg every three hours with Advil 600 mg.

## 2022-08-11 NOTE — Telephone Encounter (Signed)
  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 .

## 2022-08-12 ENCOUNTER — Telehealth: Payer: Self-pay | Admitting: Internal Medicine

## 2022-08-12 ENCOUNTER — Encounter: Payer: Self-pay | Admitting: Internal Medicine

## 2022-08-12 ENCOUNTER — Other Ambulatory Visit: Payer: Self-pay | Admitting: Internal Medicine

## 2022-08-12 DIAGNOSIS — J101 Influenza due to other identified influenza virus with other respiratory manifestations: Secondary | ICD-10-CM

## 2022-08-12 MED ORDER — HYDROCODONE BIT-HOMATROP MBR 5-1.5 MG/5ML PO SOLN: 5.0000 mL | Freq: Four times a day (QID) | ORAL | 0 refills | Status: DC | PRN

## 2022-08-12 NOTE — Telephone Encounter (Signed)
Patient informed.  Nathan Guerrero 

## 2022-08-12 NOTE — Telephone Encounter (Signed)
Copied from CRM [id number]. Topic: General - Inquiry >> Aug 12, 2022 10:05 AM Pincus Sanes wrote: [phone] Reason for CRM: Pt has called stating that he is not sleeping at all that the cough med given is not cutting it at all and wants something different. He also wants Chasity to redo the work note extending it thru Fri, so M-f with the weekend to recoup. He wants to know if anyway possible could be emailed, states does not do MyChart.

## 2022-08-14 ENCOUNTER — Telehealth: Payer: Self-pay | Admitting: Internal Medicine

## 2022-08-14 NOTE — Telephone Encounter (Signed)
Spoke with patient over the phone. Patient complaining of high fever that has now returned, vomiting and unable to hold food or drink down, severe back pains, coughing up very dark colored mucous, and feelings of being dehydrated.  Spoke with Dr Judithann Graves and informed pt he needs to go to the ER as soon as possible. He may need fluids, chest XR, and admission until his symptoms get better.  He said he does not want to go because he can't sit still in the ER because his back is hurting him so bad. Explained that we highly recommend that he goes to the ER for treatment at this time. He told me okay and thanked me and we got off the phone.  Eulanda Dorion

## 2022-08-14 NOTE — Telephone Encounter (Signed)
Copied from CRM [id number]. Topic: General - Inquiry >> Aug 14, 2022  8:39 AM Nathan Guerrero P wrote: Reason for CRM: Pt called saying he was diagnosed with the flu a and he is not getting any better.  He wants Delice Bison to call him back. CB@  [phone]

## 2022-08-14 NOTE — Telephone Encounter (Signed)
Medication Refill - Medication:   Disp Refills Start End   HYDROcodone bit-homatropine (HYCODAN) 5-1.5 MG/5ML syrup         Has the patient contacted their pharmacy? No. (Agent: If no, request that the patient contact the pharmacy for the refill. If patient does not wish to contact the pharmacy document the reason why and proceed with request.) States was told that if needed more just to call pt can't sleep for coughing if does not have.  (Agent: If yes, when and what did the pharmacy advise?)  Preferred Pharmacy (with phone number or street name):  [id number] [place], [place] - [address] Phone: [phone]  Fax: [phone]     Has the patient been seen for an appointment in the last year OR does the patient have an upcoming appointment? Yes.    Agent: Please be advised that RX refills may take up to 3 business days. We ask that you follow-up with your pharmacy.

## 2022-08-15 ENCOUNTER — Other Ambulatory Visit: Payer: Self-pay | Admitting: Internal Medicine

## 2022-08-15 DIAGNOSIS — J101 Influenza due to other identified influenza virus with other respiratory manifestations: Secondary | ICD-10-CM

## 2022-08-15 MED ORDER — HYDROCODONE BIT-HOMATROP MBR 5-1.5 MG/5ML PO SOLN: 5.0000 mL | Freq: Four times a day (QID) | ORAL | 0 refills | Status: DC | PRN

## 2022-08-15 MED ORDER — GUAIFENESIN-CODEINE 100-10 MG/5ML PO SYRP: 5.0000 mL | ORAL_SOLUTION | Freq: Four times a day (QID) | ORAL | 0 refills | Status: AC | PRN

## 2022-11-28 ENCOUNTER — Telehealth: Payer: BC Managed Care – PPO | Admitting: Nurse Practitioner

## 2022-11-28 ENCOUNTER — Ambulatory Visit: Payer: Self-pay

## 2022-11-28 DIAGNOSIS — Z20818 Contact with and (suspected) exposure to other bacterial communicable diseases: Secondary | ICD-10-CM

## 2022-11-28 DIAGNOSIS — H1013 Acute atopic conjunctivitis, bilateral: Secondary | ICD-10-CM | POA: Diagnosis not present

## 2022-11-28 DIAGNOSIS — J4521 Mild intermittent asthma with (acute) exacerbation: Secondary | ICD-10-CM

## 2022-11-28 MED ORDER — AZITHROMYCIN 250 MG PO TABS: ORAL_TABLET | ORAL | 0 refills | Status: AC

## 2022-11-28 MED ORDER — IPRATROPIUM BROMIDE 0.03 % NA SOLN: 2.0000 | Freq: Two times a day (BID) | NASAL | 12 refills | Status: DC

## 2022-11-28 NOTE — Progress Notes (Signed)
Virtual Visit Consent   Nathan Guerrero, you are scheduled for a virtual visit with a [facility] provider today. Just as with appointments in the office, your consent must be obtained to participate. Your consent will be active for this visit and any virtual visit you may have with one of our providers in the next 365 days. If you have a MyChart account, a copy of this consent can be sent to you electronically.  As this is a virtual visit, video technology does not allow for your provider to perform a traditional examination. This may limit your provider's ability to fully assess your condition. If your provider identifies any concerns that need to be evaluated in person or the need to arrange testing (such as labs, EKG, etc.), we will make arrangements to do so. Although advances in technology are sophisticated, we cannot ensure that it will always work on either your end or our end. If the connection with a video visit is poor, the visit may have to be switched to a telephone visit. With either a video or telephone visit, we are not always able to ensure that we have a secure connection.  By engaging in this virtual visit, you consent to the provision of healthcare and authorize for your insurance to be billed (if applicable) for the services provided during this visit. Depending on your insurance coverage, you may receive a charge related to this service.  I need to obtain your verbal consent now. Are you willing to proceed with your visit today? Nathan Guerrero has provided verbal consent on 11/28/2022 for a virtual visit (video or telephone). Nathan Schneiders, FNP  Date: 11/28/2022 12:27 PM  Virtual Visit via Video Note   I, Nathan Guerrero, connected with  Nathan Guerrero  ([id number], 12/09/1959) on 11/28/22 at 12:30 PM EDT by a video-enabled telemedicine application and verified that I am speaking with the correct person using two identifiers.  Location: Patient: Virtual  Visit Location Patient: Home Provider: Virtual Visit Location Provider: Home Office   I discussed the limitations of evaluation and management by telemedicine and the availability of in person appointments. The patient expressed understanding and agreed to proceed.    History of Present Illness: Nathan Guerrero is a 37 y.o. who identifies as a male who was assigned male at birth, and is being seen today with complaints of a sore throat and a cough.  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   He has had COVID three times in the past  Has not taken a COVID test   Problems:  Patient Active Problem List   Diagnosis Date Noted   Complex tear of lateral meniscus of knee 06/04/2022   Current moderate episode of major depressive disorder without prior episode ([place]) 10/19/2020   Irritable bowel syndrome 04/20/2018   Mouth ulcers 08/21/2016   Rotator cuff syndrome of left shoulder 04/25/2015   Underweight 04/25/2015    Allergies: No Known Allergies Medications:  Current Outpatient Medications:    acetaminophen (TYLENOL) 325  MG tablet, Take 500 mg by mouth every 6 (six) hours as needed. , Disp: , Rfl:    albuterol (PROVENTIL HFA;VENTOLIN HFA) 108 (90 Base) MCG/ACT inhaler, Inhale 2 puffs into the lungs every 6 (six) hours as needed for wheezing or shortness of breath., Disp: 1 Inhaler, Rfl: 0   dicyclomine (BENTYL) 10 MG capsule, Take 1 capsule (10 mg total) by mouth 4 (four) times daily as needed for spasms., Disp: 120 capsule, Rfl: 0   meloxicam (MOBIC) 15 MG tablet, Take 15 mg by mouth  daily., Disp: , Rfl:    Multiple Vitamin (MULTIVITAMIN) capsule, Take 1 capsule by mouth daily., Disp: , Rfl:    oseltamivir (TAMIFLU) 75 MG capsule, Take 1 capsule (75 mg total) by mouth 2 (two) times daily., Disp: 10 capsule, Rfl: 0  Observations/Objective: Patient is well-developed, well-nourished in no acute distress.  Resting comfortably  at home.  Head is normocephalic, atraumatic.  No labored breathing.  Speech is clear and coherent with logical content.  Patient is alert and oriented at baseline.    Assessment and Plan: 1. Allergic conjunctivitis of both eyes  - ipratropium (ATROVENT) 0.03 % nasal spray; Place 2 sprays into both nostrils every 12 (twelve) hours.  Dispense: 30 mL; Refill: 12  2. Strep throat exposure Watch and wait as current symptom are more likely associated with URI in combination with spring allergies. If ST persists with fever/ no relief from OTC and allergy regimen may start abx as discussed   - azithromycin (ZITHROMAX) 250 MG tablet; Take 2 tablets on day 1, then 1 tablet daily on days 2 through 5  Dispense: 6 tablet; Refill: 0  3. Mild intermittent asthma with acute exacerbation Continue Inhaler use every 4-6 hours as needed   - ipratropium (ATROVENT) 0.03 % nasal spray; Place 2 sprays into both nostrils every 12 (twelve) hours.  Dispense: 30 mL; Refill: 12     Follow Up Instructions: I discussed the assessment and treatment plan with the patient. The patient was provided an opportunity to ask questions and all were answered. The patient agreed with the plan and demonstrated an understanding of the instructions.  A copy of instructions were sent to the patient via MyChart unless otherwise noted below.    The patient was advised to call back or seek an in-person evaluation if the symptoms worsen or if the condition fails to improve as anticipated.  Time:  I spent 15 minutes with the patient via telehealth technology discussing the above  problems/concerns.    Nathan Schneiders, FNP

## 2022-11-28 NOTE — Telephone Encounter (Signed)
  Chief Complaint: Sore throat, BA, cough achy joints, sinus drainage, itchy eyes Symptoms: Above Frequency: Weds Pertinent Negatives: Patient denies Fever Disposition: [] ED /[] Urgent Care (no appt availability in office) / [] Appointment(In office/virtual)/ [x]  [facility]/ [] Home Care/ [] Refused Recommended Disposition /[] [facility]/ []  Follow-up with PCP Additional Notes: Pt states this started weds. 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 PT states that this started weds, but has gotten a lot worse.  Pt unable to come into office.     Summary: Symptoms, Rx request. Wants to speak to a nurse   Pt wants to speak to a nurse/PCP regarding a Rx he says he needs concerning his current symptoms. Only wants to disclose symptoms with the clinic.     Reason for Disposition  [1] Sore throat is the only symptom AND [2] present > 48 hours  Answer Assessment - Initial Assessment Questions 1. ONSET: "When did the throat start hurting?" (Hours or days ago)      weds 2. SEVERITY: "How bad is the sore throat?" (Scale 1-10; mild, moderate or severe)   - MILD (1-3):  Doesn't interfere with eating or normal activities.   - MODERATE (4-7): Interferes with eating some solids and normal activities.   - SEVERE (8-10):  Excruciating pain, interferes with most normal activities.   - SEVERE WITH DYSPHAGIA (10): Can't swallow liquids, drooling.     moderate 3. STREP EXPOSURE: "Has there been any exposure to strep within the past week?" If Yes, ask: "What type of contact occurred?"      yes 4.  VIRAL SYMPTOMS: "Are there any symptoms of a cold, such as a runny nose, cough, hoarse voice or red eyes?"      Runny nose, BA, Joints achy, cough 5. FEVER: "Do you have a fever?" If Yes, ask: "What is your temperature, how was it measured, and when did it start?"     no 6. PUS ON THE TONSILS: "Is there pus on the tonsils in the back of your throat?"     no 7. OTHER SYMPTOMS:  "Do you have any other symptoms?" (e.g., difficulty breathing, headache, rash)     HA - a  little SOB  Protocols used: Sore Throat-A-AH

## 2023-01-16 ENCOUNTER — Encounter: Payer: Self-pay | Admitting: Internal Medicine

## 2023-01-16 ENCOUNTER — Telehealth: Payer: Self-pay | Admitting: Internal Medicine

## 2023-01-16 ENCOUNTER — Telehealth (INDEPENDENT_AMBULATORY_CARE_PROVIDER_SITE_OTHER): Payer: BC Managed Care – PPO | Admitting: Internal Medicine

## 2023-01-16 VITALS — Ht 72.0 in

## 2023-01-16 DIAGNOSIS — L247 Irritant contact dermatitis due to plants, except food: Secondary | ICD-10-CM | POA: Diagnosis not present

## 2023-01-16 MED ORDER — PREDNISONE 10 MG PO TABS: ORAL_TABLET | ORAL | 0 refills | Status: AC

## 2023-01-16 NOTE — Telephone Encounter (Signed)
Received a fax that came in stating patient is itching and has poison ivy. Pt states it is spreading and request a rx for Prednisone. Spoke to Nurse and tried to reach out to patient to schedule appointment to come in. Left voicemail for patient to call back to schedule an afternoon appointment today.

## 2023-01-16 NOTE — Progress Notes (Signed)
Date:  01/16/2023   Name:  Nathan Guerrero [facility]   DOB:  02-06-1986   MRN:  [id number]  This encounter was conducted via video encounter. This platform was deemed appropriate for the issues to be addressed.  The patient was correctly identified.  I advised that I am conducting the visit from a secure room in my office at [facility] clinic.  The patient is located at work. The limitations of this form of encounter were discussed with the patient and he/she agreed to proceed.  Some vital signs will be absent.  Chief Complaint: Rash (Rash located all over body. Itching. Not painful. Worked in the yard and now has poision ivy. )  Rash This is a new problem. The problem has been gradually worsening since onset. The rash is characterized by blistering, itchiness and redness. He was exposed to plant contact. Pertinent negatives include no fatigue.    Lab Results  Component Value Date   NA 138 06/04/2022   K 4.8 06/04/2022   CO2 21 06/04/2022   GLUCOSE 100 (H) 06/04/2022   BUN 19 06/04/2022   CREATININE 1.03 06/04/2022   CALCIUM 9.9 06/04/2022   EGFR 95 06/04/2022   GFRNONAA >60 09/15/2019   Lab Results  Component Value Date   CHOL 156 06/04/2022   HDL 78 06/04/2022   LDLCALC 66 06/04/2022   TRIG 59 06/04/2022   CHOLHDL 2.0 06/04/2022   Lab Results  Component Value Date   TSH 1.400 06/04/2022   No results found for: "HGBA1C" Lab Results  Component Value Date   WBC 7.1 06/04/2022   HGB 16.0 06/04/2022   HCT 46.1 06/04/2022   MCV 87 06/04/2022   PLT 292 06/04/2022   Lab Results  Component Value Date   ALT 12 06/04/2022   AST 14 06/04/2022   ALKPHOS 108 06/04/2022   BILITOT 0.9 06/04/2022   No results found for: "25OHVITD2", "25OHVITD3", "VD25OH"   Review of Systems  Constitutional:  Negative for chills and fatigue.  Skin:  Positive for rash.  Psychiatric/Behavioral:  Positive for sleep disturbance.     Patient Active Problem List   Diagnosis Date Noted    Complex tear of lateral meniscus of knee 06/04/2022   Current moderate episode of major depressive disorder without prior episode (HCC) 10/19/2020   Irritable bowel syndrome 04/20/2018   Mouth ulcers 08/21/2016   Rotator cuff syndrome of left shoulder 04/25/2015   Underweight 04/25/2015    No Known Allergies  Past Surgical History:  Procedure Laterality Date   WISDOM TOOTH EXTRACTION      Social History   Tobacco Use   Smoking status: Never   Smokeless tobacco: Never  Vaping Use   Vaping Use: Never used  Substance Use Topics   Alcohol use: Yes    Alcohol/week: 0.0 standard drinks of alcohol   Drug use: No     Medication list has been reviewed and updated.  Current Meds  Medication Sig   acetaminophen (TYLENOL) 325 MG tablet Take 500 mg by mouth every 6 (six) hours as needed.    dicyclomine (BENTYL) 10 MG capsule Take 1 capsule (10 mg total) by mouth 4 (four) times daily as needed for spasms.   ipratropium (ATROVENT) 0.03 % nasal spray Place 2 sprays into both nostrils every 12 (twelve) hours.   Multiple Vitamin (MULTIVITAMIN) capsule Take 1 capsule by mouth daily.   predniSONE (DELTASONE) 10 MG tablet Take 6 tablets (60 mg total) by mouth daily with breakfast for 1  day, THEN 5 tablets (50 mg total) daily with breakfast for 1 day, THEN 4 tablets (40 mg total) daily with breakfast for 1 day, THEN 3 tablets (30 mg total) daily with breakfast for 1 day, THEN 2 tablets (20 mg total) daily with breakfast for 1 day, THEN 1 tablet (10 mg total) daily with breakfast for 1 day.   [DISCONTINUED] meloxicam (MOBIC) 15 MG tablet Take 15 mg by mouth daily.       01/16/2023    1:44 PM 06/04/2022    9:17 AM 12/16/2021   10:23 AM 07/16/2021   11:22 AM  GAD 7 : Generalized Anxiety Score  Nervous, Anxious, on Edge 1 1 0 0  Control/stop worrying 1 1 0 0  Worry too much - different things 1 3 0 0  Trouble relaxing 1 2 0 0  Restless 1 1 0 0  Easily annoyed or irritable 1 1 0 0  Afraid -  awful might happen 0 1 0 0  Total GAD 7 Score 6 10 0 0  Anxiety Difficulty Somewhat difficult Very difficult Not difficult at all Not difficult at all       01/16/2023    1:44 PM 06/04/2022    9:16 AM 12/16/2021   10:23 AM  Depression screen PHQ 2/9  Decreased Interest 0 0 0  Down, Depressed, Hopeless 0 0 0  PHQ - 2 Score 0 0 0  Altered sleeping 0 3 0  Tired, decreased energy 0 2 0  Change in appetite 0 0 0  Feeling bad or failure about yourself  0 0 0  Trouble concentrating 0 0 0  Moving slowly or fidgety/restless 0 0 0  Suicidal thoughts 0 0 0  PHQ-9 Score 0 5 0  Difficult doing work/chores Not difficult at all Not difficult at all Not difficult at all    BP Readings from Last 3 Encounters:  08/11/22 110/70  06/04/22 100/76  12/16/21 122/70    Physical Exam Vitals and nursing note reviewed.  Constitutional:      General: He is not in acute distress.    Appearance: He is well-developed.  HENT:     Head: Normocephalic and atraumatic.  Pulmonary:     Effort: Pulmonary effort is normal. No respiratory distress.  Skin:    Findings: Rash (seen on arms c/w poison ivy) present.  Neurological:     Mental Status: He is alert and oriented to person, place, and time.  Psychiatric:        Mood and Affect: Mood normal.        Behavior: Behavior normal.     Wt Readings from Last 3 Encounters:  08/11/22 175 lb (79.4 kg)  06/04/22 175 lb (79.4 kg)  12/16/21 162 lb 6.4 oz (73.7 kg)    Ht 6' (1.829 m)   BMI 23.73 kg/m   Assessment and Plan:  Problem List Items Addressed This Visit   None Visit Diagnoses     Irritant contact dermatitis due to plants, except food    -  Primary   Can also take Claritin in the AM and Benadryl in the PM   Relevant Medications   predniSONE (DELTASONE) 10 MG tablet      I spent 6 minutes on this encounter, 100% by video.  No follow-ups on file.   Partially dictated using Dragon software, any errors are not intentional.  Reubin Milan, MD [facility] [place], [place]

## 2023-06-09 ENCOUNTER — Ambulatory Visit (INDEPENDENT_AMBULATORY_CARE_PROVIDER_SITE_OTHER): Payer: BC Managed Care – PPO | Admitting: Internal Medicine

## 2023-06-09 ENCOUNTER — Encounter: Payer: Self-pay | Admitting: Internal Medicine

## 2023-06-09 VITALS — BP 112/70 | HR 70 | Temp 97.7°F | Resp 14 | Ht 72.0 in | Wt 178.8 lb

## 2023-06-09 DIAGNOSIS — K589 Irritable bowel syndrome without diarrhea: Secondary | ICD-10-CM

## 2023-06-09 DIAGNOSIS — F419 Anxiety disorder, unspecified: Secondary | ICD-10-CM

## 2023-06-09 DIAGNOSIS — F321 Major depressive disorder, single episode, moderate: Secondary | ICD-10-CM | POA: Diagnosis not present

## 2023-06-09 DIAGNOSIS — Z13220 Encounter for screening for lipoid disorders: Secondary | ICD-10-CM

## 2023-06-09 DIAGNOSIS — Z0000 Encounter for general adult medical examination without abnormal findings: Secondary | ICD-10-CM | POA: Diagnosis not present

## 2023-06-09 MED ORDER — ALPRAZOLAM 0.25 MG PO TABS
0.2500 mg | ORAL_TABLET | Freq: Every evening | ORAL | 0 refills | Status: DC | PRN
Start: 2023-06-09 — End: 2023-09-18

## 2023-06-09 NOTE — Progress Notes (Addendum)
Date:  06/09/2023   Name:  Nathan Guerrero [facility]   DOB:  August 05, 1986   MRN:  [id number]   Chief Complaint: Annual Exam Nathan Guerrero is a 37 y.o. male who presents today for his Complete Annual Exam. He feels fairly well. He reports exercising regularly. He reports he is sleeping fairly well.   Colonoscopy: none  Immunization History  Administered Date(s) Administered   Meningococcal Conjugate 02/11/2008   PFIZER(Purple Top)SARS-COV-2 Vaccination 11/21/2019, 12/12/2019   Tdap 06/04/2022   Health Maintenance Due  Topic Date Due   HIV Screening  Never done   COVID-19 Vaccine (3 - 2023-24 season) 05/10/2023    No results found for: "PSA1", "PSA"  Depression        This is a recurrent problem.  The problem has been gradually improving since onset.  Associated symptoms include decreased concentration, hopelessness, insomnia, restlessness and appetite change.  Associated symptoms include no fatigue, no myalgias, no headaches and no suicidal ideas.     The symptoms are aggravated by family issues, social issues and work stress.  Past treatments include nothing.  Past medical history includes anxiety.   Anxiety Symptoms include decreased concentration, insomnia, nervous/anxious behavior and restlessness. Patient reports no chest pain, dizziness, palpitations, shortness of breath or suicidal ideas.     Lab Results  Component Value Date   NA 138 06/04/2022   K 4.8 06/04/2022   CO2 21 06/04/2022   GLUCOSE 100 (H) 06/04/2022   BUN 19 06/04/2022   CREATININE 1.03 06/04/2022   CALCIUM 9.9 06/04/2022   EGFR 95 06/04/2022   GFRNONAA >60 09/15/2019   Lab Results  Component Value Date   CHOL 156 06/04/2022   HDL 78 06/04/2022   LDLCALC 66 06/04/2022   TRIG 59 06/04/2022   CHOLHDL 2.0 06/04/2022   Lab Results  Component Value Date   TSH 1.400 06/04/2022   No results found for: "HGBA1C" Lab Results  Component Value Date   WBC 7.1 06/04/2022   HGB 16.0 06/04/2022    HCT 46.1 06/04/2022   MCV 87 06/04/2022   PLT 292 06/04/2022   Lab Results  Component Value Date   ALT 12 06/04/2022   AST 14 06/04/2022   ALKPHOS 108 06/04/2022   BILITOT 0.9 06/04/2022   No results found for: "25OHVITD2", "25OHVITD3", "VD25OH"   Review of Systems  Constitutional:  Positive for appetite change. Negative for chills, diaphoresis, fatigue and unexpected weight change.  HENT:  Negative for hearing loss, tinnitus, trouble swallowing and voice change.   Eyes:  Negative for visual disturbance.  Respiratory:  Negative for choking, shortness of breath and wheezing.   Cardiovascular:  Negative for chest pain, palpitations and leg swelling.  Gastrointestinal:  Negative for abdominal pain, blood in stool, constipation and diarrhea.  Genitourinary:  Negative for difficulty urinating, dysuria and frequency.  Musculoskeletal:  Negative for arthralgias, back pain and myalgias.  Skin:  Negative for color change and rash.  Neurological:  Negative for dizziness, syncope and headaches.  Hematological:  Negative for adenopathy.  Psychiatric/Behavioral:  Positive for decreased concentration, depression and dysphoric mood. Negative for sleep disturbance and suicidal ideas. The patient is nervous/anxious and has insomnia.     Patient Active Problem List   Diagnosis Date Noted   Complex tear of lateral meniscus of knee 06/04/2022   Current moderate episode of major depressive disorder without prior episode (HCC) 10/19/2020   Irritable bowel syndrome 04/20/2018   Mouth ulcers 08/21/2016   Rotator cuff syndrome of  left shoulder 04/25/2015    No Known Allergies  Past Surgical History:  Procedure Laterality Date   WISDOM TOOTH EXTRACTION      Social History   Tobacco Use   Smoking status: Never   Smokeless tobacco: Never  Vaping Use   Vaping status: Never Used  Substance Use Topics   Alcohol use: Yes    Alcohol/week: 0.0 standard drinks of alcohol   Drug use: No      Medication list has been reviewed and updated.  Current Meds  Medication Sig   acetaminophen (TYLENOL) 325 MG tablet Take 500 mg by mouth every 6 (six) hours as needed.    ALPRAZolam (XANAX) 0.25 MG tablet Take 1 tablet (0.25 mg total) by mouth at bedtime as needed for anxiety.   dicyclomine (BENTYL) 10 MG capsule Take 1 capsule (10 mg total) by mouth 4 (four) times daily as needed for spasms.       06/09/2023    8:21 AM 01/16/2023    1:44 PM 06/04/2022    9:17 AM 12/16/2021   10:23 AM  GAD 7 : Generalized Anxiety Score  Nervous, Anxious, on Edge 3 1 1  0  Control/stop worrying 2 1 1  0  Worry too much - different things 3 1 3  0  Trouble relaxing 2 1 2  0  Restless 1 1 1  0  Easily annoyed or irritable 2 1 1  0  Afraid - awful might happen 2 0 1 0  Total GAD 7 Score 15 6 10  0  Anxiety Difficulty Somewhat difficult Somewhat difficult Very difficult Not difficult at all       06/09/2023    8:12 AM 01/16/2023    1:44 PM 06/04/2022    9:16 AM  Depression screen PHQ 2/9  Decreased Interest 1 0 0  Down, Depressed, Hopeless 2 0 0  PHQ - 2 Score 3 0 0  Altered sleeping 3 0 3  Tired, decreased energy 2 0 2  Change in appetite 2 0 0  Feeling bad or failure about yourself  1 0 0  Trouble concentrating 1 0 0  Moving slowly or fidgety/restless 1 0 0  Suicidal thoughts 0 0 0  PHQ-9 Score 13 0 5  Difficult doing work/chores Somewhat difficult Not difficult at all Not difficult at all    BP Readings from Last 3 Encounters:  06/09/23 112/70  08/11/22 110/70  06/04/22 100/76    Physical Exam Vitals and nursing note reviewed.  Constitutional:      Appearance: Normal appearance. He is well-developed.  HENT:     Head: Normocephalic.     Right Ear: Tympanic membrane, ear canal and external ear normal.     Left Ear: Tympanic membrane, ear canal and external ear normal.     Nose: Nose normal.  Eyes:     Conjunctiva/sclera: Conjunctivae normal.     Pupils: Pupils are equal, round,  and reactive to light.  Neck:     Thyroid: No thyromegaly.     Vascular: No carotid bruit.  Cardiovascular:     Rate and Rhythm: Normal rate and regular rhythm.     Heart sounds: Normal heart sounds.  Pulmonary:     Effort: Pulmonary effort is normal.     Breath sounds: Normal breath sounds. No wheezing.  Chest:  Breasts:    Right: No mass.     Left: No mass.  Abdominal:     General: Bowel sounds are normal.     Palpations: Abdomen is soft.  Tenderness: There is no abdominal tenderness.  Musculoskeletal:        General: Normal range of motion.     Cervical back: Normal range of motion and neck supple.  Lymphadenopathy:     Cervical: No cervical adenopathy.  Skin:    General: Skin is warm and dry.  Neurological:     Mental Status: He is alert and oriented to person, place, and time.     Nathan Tendon Reflexes: Reflexes are normal and symmetric.  Psychiatric:        Attention and Perception: Attention normal.        Mood and Affect: Mood normal.        Thought Content: Thought content normal.     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 Will start medication along with low dose Xanax to use PRN      Relevant Medications   ALPRAZolam (XANAX) 0.25 MG tablet   Other Relevant Orders   TSH   Irritable bowel syndrome (Chronic)    Managed with diet and Bentyl PRN No hx of colonoscopy      Relevant Orders   CBC  with Differential/Platelet   Comprehensive metabolic panel   Other Visit Diagnoses     Annual physical exam    -  Primary   Relevant Orders   CBC with Differential/Platelet   Comprehensive metabolic panel   Lipid panel   TSH   Screening for lipid disorders       Relevant Orders   Lipid panel   Anxiety       Relevant Medications   ALPRAZolam (XANAX) 0.25 MG tablet       Return in about 1 month (around 07/10/2023) for Depression.    Reubin Milan, MD [facility] [place]

## 2023-06-09 NOTE — Assessment & Plan Note (Signed)
Having more depressive symptoms - never took Lexapro. Will start medication along with low dose Xanax to use PRN

## 2023-06-09 NOTE — Assessment & Plan Note (Signed)
Managed with diet and Bentyl PRN No hx of colonoscopy

## 2023-06-10 LAB — CBC WITH DIFFERENTIAL/PLATELET
Basophils Absolute: 0.1 10*3/uL (ref 0.0–0.2)
Basos: 1 %
EOS (ABSOLUTE): 0.2 10*3/uL (ref 0.0–0.4)
Eos: 2 %
Hematocrit: 47.5 % (ref 37.5–51.0)
Hemoglobin: 15.6 g/dL (ref 13.0–17.7)
Immature Grans (Abs): 0 10*3/uL (ref 0.0–0.1)
Immature Granulocytes: 0 %
Lymphocytes Absolute: 1.6 10*3/uL (ref 0.7–3.1)
Lymphs: 24 %
MCH: 29.8 pg (ref 26.6–33.0)
MCHC: 32.8 g/dL (ref 31.5–35.7)
MCV: 91 fL (ref 79–97)
Monocytes Absolute: 0.6 10*3/uL (ref 0.1–0.9)
Monocytes: 8 %
Neutrophils Absolute: 4.3 10*3/uL (ref 1.4–7.0)
Neutrophils: 65 %
Platelets: 289 10*3/uL (ref 150–450)
RBC: 5.23 x10E6/uL (ref 4.14–5.80)
RDW: 12.9 % (ref 11.6–15.4)
WBC: 6.8 10*3/uL (ref 3.4–10.8)

## 2023-06-10 LAB — COMPREHENSIVE METABOLIC PANEL
ALT: 37 [IU]/L (ref 0–44)
AST: 31 [IU]/L (ref 0–40)
Albumin: 4.7 g/dL (ref 4.1–5.1)
Alkaline Phosphatase: 111 [IU]/L (ref 44–121)
BUN/Creatinine Ratio: 13 (ref 9–20)
BUN: 12 mg/dL (ref 6–20)
Bilirubin Total: 0.5 mg/dL (ref 0.0–1.2)
CO2: 24 mmol/L (ref 20–29)
Calcium: 9.9 mg/dL (ref 8.7–10.2)
Chloride: 99 mmol/L (ref 96–106)
Creatinine, Ser: 0.92 mg/dL (ref 0.76–1.27)
Globulin, Total: 2.1 g/dL (ref 1.5–4.5)
Glucose: 93 mg/dL (ref 70–99)
Potassium: 5 mmol/L (ref 3.5–5.2)
Sodium: 138 mmol/L (ref 134–144)
Total Protein: 6.8 g/dL (ref 6.0–8.5)
eGFR: 109 mL/min/{1.73_m2} (ref >59)

## 2023-06-10 LAB — LIPID PANEL
Chol/HDL Ratio: 2.3 {ratio} (ref 0.0–5.0)
Cholesterol, Total: 155 mg/dL (ref 100–199)
HDL: 68 mg/dL (ref >39)
LDL Chol Calc (NIH): 67 mg/dL (ref 0–99)
Triglycerides: 111 mg/dL (ref 0–149)
VLDL Cholesterol Cal: 20 mg/dL (ref 5–40)

## 2023-06-10 LAB — TSH: TSH: 1.67 u[IU]/mL (ref 0.450–4.500)

## 2023-07-10 ENCOUNTER — Ambulatory Visit: Payer: BC Managed Care – PPO | Admitting: Internal Medicine

## 2023-09-17 ENCOUNTER — Other Ambulatory Visit: Payer: Self-pay | Admitting: Internal Medicine

## 2023-09-17 DIAGNOSIS — F419 Anxiety disorder, unspecified: Secondary | ICD-10-CM

## 2023-09-18 ENCOUNTER — Telehealth: Payer: Self-pay | Admitting: Internal Medicine

## 2023-09-18 NOTE — Telephone Encounter (Signed)
 Medication Refill -  Most Recent Primary Care Visit:  Provider: BERGLUND, LAURA H  Department: [facility]  Visit Type: PHYSICAL  Date: 06/09/2023  Medication: ALPRAZolam  (XANAX ) 0.25 MG tablet [[id number]]   Has the patient contacted their pharmacy? Yes (Agent: If no, request that the patient contact the pharmacy for the refill. If patient does not wish to contact the pharmacy document the reason why and proceed with request.) (Agent: If yes, when and what did the pharmacy advise?)  Is this the correct pharmacy for this prescription? Yes If no, delete pharmacy and type the correct one.  This is the patient's preferred pharmacy:  [facility] [phone] [address], [place] - [address] [place] [address] Phone: [phone] Fax: [phone]   Has the prescription been filled recently? Yes  Is the patient out of the medication? Yes Patient reporting that he has been out of medication since Christmas. Reporting that he is having restless sleep.   Has the patient been seen for an appointment in the last year OR does the patient have an upcoming appointment? Yes  Can we respond through MyChart? Yes  Agent: Please be advised that Rx refills may take up to 3 business days. We ask that you follow-up with your pharmacy.

## 2023-09-18 NOTE — Telephone Encounter (Signed)
 Duplicate request- Rx has been pended for provider review

## 2023-09-18 NOTE — Telephone Encounter (Signed)
 Requested medication (s) are due for refill today - yes  Requested medication (s) are on the active medication list -yes  Future visit scheduled -no  Last refill: 06/09/23 #10  Notes to clinic: non delegated Rx  Requested Prescriptions  Pending Prescriptions Disp Refills   ALPRAZolam  (XANAX ) 0.25 MG tablet [Pharmacy Med Name: ALPRAZOLAM  0.25 MG TABLET] 10 tablet 0    Sig: TAKE 1 TABLET BY MOUTH AT BEDTIME AS NEEDED FOR ANXIETY.     Not Delegated - Psychiatry: Anxiolytics/Hypnotics 2 Failed - 09/18/2023 11:53 AM      Failed - This refill cannot be delegated      Failed - Urine Drug Screen completed in last 360 days      Passed - Patient is not pregnant      Passed - Valid encounter within last 6 months    Recent Outpatient Visits           3 months ago Annual physical exam   [place] [facility] at [facility], Leita DEL, MD   8 months ago Irritant contact dermatitis due to plants, except food   [place] [facility] at [facility], Leita DEL, MD   1 year ago Influenza A   [place] [facility] at [facility], Leita DEL, MD   1 year ago Annual physical exam   [facility] at [facility], Leita DEL, MD   1 year ago Other mucopurulent conjunctivitis of left eye   [place] [facility] at [facility], Leita DEL, MD                 Requested Prescriptions  Pending Prescriptions Disp Refills   ALPRAZolam  (XANAX ) 0.25 MG tablet [Pharmacy Med Name: ALPRAZOLAM  0.25 MG TABLET] 10 tablet 0    Sig: TAKE 1 TABLET BY MOUTH AT BEDTIME AS NEEDED FOR ANXIETY.     Not Delegated - Psychiatry: Anxiolytics/Hypnotics 2 Failed - 09/18/2023 11:53 AM      Failed - This refill cannot be delegated      Failed - Urine Drug Screen completed in last 360 days      Passed - Patient is not pregnant      Passed - Valid  encounter within last 6 months    Recent Outpatient Visits           3 months ago Annual physical exam   [place] [facility] at [facility], Leita DEL, MD   8 months ago Irritant contact dermatitis due to plants, except food   [place] [facility] at [facility], Leita DEL, MD   1 year ago Influenza A   [place] [facility] at [facility], Leita DEL, MD   1 year ago Annual physical exam   [facility] at [facility], Leita DEL, MD   1 year ago Other mucopurulent conjunctivitis of left eye   [facility] at [facility], Leita DEL, MD

## 2023-10-02 ENCOUNTER — Encounter: Payer: Self-pay | Admitting: Family Medicine

## 2023-10-02 ENCOUNTER — Ambulatory Visit: Payer: Self-pay | Admitting: *Deleted

## 2023-10-02 ENCOUNTER — Ambulatory Visit (INDEPENDENT_AMBULATORY_CARE_PROVIDER_SITE_OTHER): Payer: Self-pay | Admitting: Family Medicine

## 2023-10-02 VITALS — BP 122/84 | HR 98 | Ht 72.0 in | Wt 186.0 lb

## 2023-10-02 DIAGNOSIS — J989 Respiratory disorder, unspecified: Secondary | ICD-10-CM

## 2023-10-02 DIAGNOSIS — R509 Fever, unspecified: Secondary | ICD-10-CM | POA: Diagnosis not present

## 2023-10-02 MED ORDER — ONDANSETRON 8 MG PO TBDP: 8.0000 mg | ORAL_TABLET | Freq: Three times a day (TID) | ORAL | 0 refills | Status: DC | PRN

## 2023-10-02 MED ORDER — ALBUTEROL SULFATE HFA 108 (90 BASE) MCG/ACT IN AERS: 2.0000 | INHALATION_SPRAY | Freq: Four times a day (QID) | RESPIRATORY_TRACT | 0 refills | Status: AC | PRN

## 2023-10-02 MED ORDER — AZITHROMYCIN 250 MG PO TABS: ORAL_TABLET | ORAL | 0 refills | Status: AC

## 2023-10-02 NOTE — Telephone Encounter (Signed)
Reason for Disposition  SEVERE (e.g., excruciating) throat pain  Answer Assessment - Initial Assessment Questions 1. ONSET: "When did the throat start hurting?" (Hours or days ago)      2-3 days 2. SEVERITY: "How bad is the sore throat?" (Scale 1-10; mild, moderate or severe)   - MILD (1-3):  Doesn't interfere with eating or normal activities.   - MODERATE (4-7): Interferes with eating some solids and normal activities.   - SEVERE (8-10):  Excruciating pain, interferes with most normal activities.   - SEVERE WITH DYSPHAGIA (10): Can't swallow liquids, drooling.     Moderate/severe 3. 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  VIRAL SYMPTOMS: "Are there any symptoms of a cold, such as a runny nose, cough, hoarse voice or red eyes?"      Cough, congestion 5. FEVER: "Do you have a fever?" If Yes, ask: "What is your temperature, how was it measured, and when did it start?"     Not sure now- on/off 6. PUS ON THE TONSILS: "Is there pus on the tonsils in the back of your throat?"     Red, swollen  Protocols used: Sore Throat-A-AH

## 2023-10-02 NOTE — Patient Instructions (Signed)
-   Take antibiotics for full course - Use albuterol inhaler on an as-needed basis for shortness of breath - Use Zofran on an as-needed basis for nausea - Use Mucinex 12-hour twice daily - Use Tylenol and ibuprofen on an as-needed basis - Use Chloraseptic for throat pain - Contact our office for any persistent symptoms that fail to improve towards the middle of next week

## 2023-10-02 NOTE — Telephone Encounter (Signed)
.   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 Appointment scheduled.

## 2023-10-08 DIAGNOSIS — R509 Fever, unspecified: Secondary | ICD-10-CM | POA: Insufficient documentation

## 2023-10-08 DIAGNOSIS — J989 Respiratory disorder, unspecified: Secondary | ICD-10-CM | POA: Insufficient documentation

## 2023-10-08 NOTE — Progress Notes (Signed)
Primary Care / Sports Medicine Office Visit  Patient Information:  Patient ID: Nathan Guerrero, male DOB: 1985/11/21 Age: 38 y.o. MRN: [id number]   Nathan Guerrero is a pleasant 38 y.o. male presenting with the following:  Chief Complaint  Patient presents with   Sore Throat    Patient started having difficulty swallowing on Tuesday morning.   Sinusitis    Patients ears and sinuses are congested and clogged since Wednesday evening.    Nasal Congestion    Constant nasal drip and congestion since Wednesday.     Vitals:   10/02/23 1525  BP: 122/84  Pulse: 98  SpO2: 98%   Vitals:   10/02/23 1525  Weight: 186 lb (84.4 kg)  Height: 6' (1.829 m)   Body mass index is 25.23 kg/m.  No results found.   Independent interpretation of notes and tests performed by another provider:   None  Procedures performed:   None  Pertinent History, Exam, Impression, and Recommendations:   Problem List Items Addressed This Visit     Respiratory illness with fever - Primary   History of Present Illness Nathan Guerrero presents with a week-long history of severe upper respiratory symptoms that began with a sore throat and progressed to involve his sinuses, eyes, ears, and nose. He reports that his throat felt like "swallowing razor blades." He also noticed small amounts of blood in his phlegm, which he attributes to the rawness of his throat. His sinus congestion has been severe, causing significant discomfort around his nasal bridge and under his eyes. His ears have been "popped" since Tuesday.  In addition to these symptoms, Nathan Guerrero has experienced digestive issues, including vomiting and diarrhea. He reports that his vomit mostly consists of the fluids he has been consuming, such as Gatorade, ginger ale, and water. He has not noticed any mucus-like contents in his vomit or stool.  Nathan Guerrero also reports experiencing fever for three consecutive days (Tuesday, Wednesday, and Thursday),  with the highest temperature being 100.45F. He has been managing his fever with Tylenol. He also reports joint aches, particularly in his knees, elbows, and hips.  He has been taking over-the-counter medications, including DayQuil, Nyquil, and extra-strength Tylenol. He also started taking over-the-counter sinus infection medication from [facility]. He has a history of asthma and used to use an albuterol inhaler, which he still has at home.  Physical Exam VITALS: Oxygen saturation at 98%. HEENT: Nasal mucosa appears erythematous and swollen. Sinuses tender to palpation. CHEST: Coarse breath sounds auscultated.  Results LABS Oxygen saturation: 98% (September 2023) Oxygen saturation: 90% (October 2024) Oxygen saturation: 95% (August 12, 2023)  Assessment and Plan Upper Respiratory Infection and Sinusitis, GI symptoms Symptoms include sore throat, sinus pressure, nasal congestion, and cough. Noted blood in phlegm likely due to throat irritation. Improvement in throat pain noted. -Prescribe Azithromycin (Z-Pak) for bacterial coverage. -Advise continued use of over-the-counter DayQuil/Nyquil and Tylenol. -Recommend purchase of Chloraseptic for throat pain relief and Mucinex for mucus clearance.  Lower Respiratory Inflammation Coarse breath sounds noted on examination, possibly due to inflammation or mucus in the airways. History of asthma. -Prescribe Albuterol inhaler for use as needed.  Gastrointestinal Symptoms Vomiting and diarrhea leading to significant weight loss. No mucus or blood noted in vomit or stool. -Prescribe Zofran for nausea and vomiting control. -Advise hydration with water and electrolyte-containing drinks.      I provided a total time of 34 minutes including both face-to-face and non-face-to-face time on 10/08/2023 inclusive of time utilized  for medical chart review, information gathering, care coordination with staff, and documentation completion.   Orders &  Medications Medications:  Meds ordered this encounter  Medications   azithromycin (ZITHROMAX) 250 MG tablet    Sig: Take 2 tablets on day 1, then 1 tablet daily on days 2 through 5    Dispense:  6 tablet    Refill:  0   albuterol (VENTOLIN HFA) 108 (90 Base) MCG/ACT inhaler    Sig: Inhale 2 puffs into the lungs every 6 (six) hours as needed for wheezing or shortness of breath.    Dispense:  1 each    Refill:  0    Please use generic pro-air   ondansetron (ZOFRAN-ODT) 8 MG disintegrating tablet    Sig: Take 1 tablet (8 mg total) by mouth every 8 (eight) hours as needed for nausea.    Dispense:  20 tablet    Refill:  0   No orders of the defined types were placed in this encounter.    No follow-ups on file.     Jerrol Banana, MD, [facility]   Primary Care Sports Medicine Primary Care and Sports Medicine at [facility]

## 2023-10-08 NOTE — Assessment & Plan Note (Signed)
History of Present Illness Cortney presents with a week-long history of severe upper respiratory symptoms that began with a sore throat and progressed to involve his sinuses, eyes, ears, and nose. He reports that his throat felt like "swallowing razor blades." He also noticed small amounts of blood in his phlegm, which he attributes to the rawness of his throat. His sinus congestion has been severe, causing significant discomfort around his nasal bridge and under his eyes. His ears have been "popped" since Tuesday.  In addition to these symptoms, Baraa has experienced digestive issues, including vomiting and diarrhea. He reports that his vomit mostly consists of the fluids he has been consuming, such as Gatorade, ginger ale, and water. He has not noticed any mucus-like contents in his vomit or stool.  Rosevelt also reports experiencing fever for three consecutive days (Tuesday, Wednesday, and Thursday), with the highest temperature being 100.10F. He has been managing his fever with Tylenol. He also reports joint aches, particularly in his knees, elbows, and hips.  He has been taking over-the-counter medications, including DayQuil, Nyquil, and extra-strength Tylenol. He also started taking over-the-counter sinus infection medication from [facility]. He has a history of asthma and used to use an albuterol inhaler, which he still has at home.  Physical Exam VITALS: Oxygen saturation at 98%. HEENT: Nasal mucosa appears erythematous and swollen. Sinuses tender to palpation. CHEST: Coarse breath sounds auscultated.  Results LABS Oxygen saturation: 98% (September 2023) Oxygen saturation: 90% (October 2024) Oxygen saturation: 95% (August 12, 2023)  Assessment and Plan Upper Respiratory Infection and Sinusitis, GI symptoms Symptoms include sore throat, sinus pressure, nasal congestion, and cough. Noted blood in phlegm likely due to throat irritation. Improvement in throat pain noted. -Prescribe Azithromycin  (Z-Pak) for bacterial coverage. -Advise continued use of over-the-counter DayQuil/Nyquil and Tylenol. -Recommend purchase of Chloraseptic for throat pain relief and Mucinex for mucus clearance.  Lower Respiratory Inflammation Coarse breath sounds noted on examination, possibly due to inflammation or mucus in the airways. History of asthma. -Prescribe Albuterol inhaler for use as needed.  Gastrointestinal Symptoms Vomiting and diarrhea leading to significant weight loss. No mucus or blood noted in vomit or stool. -Prescribe Zofran for nausea and vomiting control. -Advise hydration with water and electrolyte-containing drinks.

## 2023-11-05 ENCOUNTER — Other Ambulatory Visit: Payer: Self-pay | Admitting: Internal Medicine

## 2023-11-05 ENCOUNTER — Encounter: Payer: Self-pay | Admitting: Internal Medicine

## 2023-11-05 ENCOUNTER — Ambulatory Visit: Payer: Self-pay | Admitting: Internal Medicine

## 2023-11-05 VITALS — BP 112/68 | HR 75 | Ht 72.0 in | Wt 177.2 lb

## 2023-11-05 DIAGNOSIS — F321 Major depressive disorder, single episode, moderate: Secondary | ICD-10-CM | POA: Diagnosis not present

## 2023-11-05 DIAGNOSIS — F419 Anxiety disorder, unspecified: Secondary | ICD-10-CM

## 2023-11-05 MED ORDER — ESCITALOPRAM OXALATE 10 MG PO TABS
10.0000 mg | ORAL_TABLET | Freq: Every day | ORAL | 1 refills | Status: DC
Start: 2023-11-05 — End: 2023-12-03

## 2023-11-05 NOTE — Patient Instructions (Addendum)
 Start Lexapro 1/2 tablet daily for 4 days then one a day.  Look into counseling services - online or through work  Below are places you can call and schedule an appt to see for [employer] [facility]:   Psychiatry Locations: [employer] [employer] [place]  [phone] [facility]     [phone] [facility] [phone] [facility] [place] [phone] [employer] [facility] [place] [phone] [facility] - [place] [phone] [facility]   [phone] Dr. Caryn Section      [phone]- [place]  [phone]  [facility]    [phone]       Looking for Counseling Services? [email or web address]- Virtual Counseling BetterHelp- Virtual Counseling [email or web address]

## 2023-11-05 NOTE — Assessment & Plan Note (Addendum)
 Having more issues struggling with work-life balance; time management, etc OCD symptoms continue He admits to more anxiety than in the past. Recommend counseling services and begin Lexapro 10 mg Follow up in 4 weeks.

## 2023-11-05 NOTE — Progress Notes (Signed)
 Date:  11/05/2023   Name:  [facility]   DOB:  [birth year]   MRN:  [id number]   Chief Complaint: No chief complaint on file.  Depression        This is a recurrent problem.  The onset quality is gradual.   Associated symptoms include decreased concentration, insomnia, irritable, restlessness and appetite change.  Associated symptoms include no fatigue, no headaches and no suicidal ideas.     The symptoms are aggravated by social issues.  Past treatments include nothing.   Review of Systems  Constitutional:  Positive for appetite change. Negative for fatigue and unexpected weight change.  Respiratory:  Negative for chest tightness and shortness of breath.   Cardiovascular:  Negative for chest pain and palpitations.  Neurological:  Negative for dizziness and headaches.  Psychiatric/Behavioral:  Positive for decreased concentration, depression and sleep disturbance. Negative for suicidal ideas. The patient is nervous/anxious and has insomnia.      Lab Results  Component Value Date   NA 138 06/09/2023   K 5.0 06/09/2023   CO2 24 06/09/2023   GLUCOSE 93 06/09/2023   BUN 12 06/09/2023   CREATININE 0.92 06/09/2023   CALCIUM 9.9 06/09/2023   EGFR 109 06/09/2023   GFRNONAA >60 09/15/2019   Lab Results  Component Value Date   CHOL 155 06/09/2023   HDL 68 06/09/2023   LDLCALC 67 06/09/2023   TRIG 111 06/09/2023   CHOLHDL 2.3 06/09/2023   Lab Results  Component Value Date   TSH 1.670 06/09/2023   No results found for: "HGBA1C" Lab Results  Component Value Date   WBC 6.8 06/09/2023   HGB 15.6 06/09/2023   HCT 47.5 06/09/2023   MCV 91 06/09/2023   PLT 289 06/09/2023   Lab Results  Component Value Date   ALT 37 06/09/2023   AST 31 06/09/2023   ALKPHOS 111 06/09/2023   BILITOT 0.5 06/09/2023   No results found for: "25OHVITD2", "25OHVITD3", "VD25OH"   Patient Active Problem List   Diagnosis Date Noted   Respiratory illness with fever 10/08/2023   Complex  tear of lateral meniscus of knee 06/04/2022   Current moderate episode of major depressive disorder without prior episode (HCC) 10/19/2020   Irritable bowel syndrome 04/20/2018   Mouth ulcers 08/21/2016   Rotator cuff syndrome of left shoulder 04/25/2015    No Known Allergies  Past Surgical History:  Procedure Laterality Date   WISDOM TOOTH EXTRACTION      Social History   Tobacco Use   Smoking status: Never   Smokeless tobacco: Never  Vaping Use   Vaping status: Never Used  Substance Use Topics   Alcohol use: Yes    Alcohol/week: 0.0 standard drinks of alcohol   Drug use: No     Medication list has been reviewed and updated.  Current Meds  Medication Sig   acetaminophen (TYLENOL) 325 MG tablet Take 500 mg by mouth every 6 (six) hours as needed.    albuterol (VENTOLIN HFA) 108 (90 Base) MCG/ACT inhaler Inhale 2 puffs into the lungs every 6 (six) hours as needed for wheezing or shortness of breath.   ALPRAZolam (XANAX) 0.25 MG tablet TAKE 1 TABLET BY MOUTH AT BEDTIME AS NEEDED FOR ANXIETY.   escitalopram (LEXAPRO) 10 MG tablet Take 1 tablet (10 mg total) by mouth daily.   ondansetron (ZOFRAN-ODT) 8 MG disintegrating tablet Take 1 tablet (8 mg total) by mouth every 8 (eight) hours as needed for nausea.  11/05/2023   10:40 AM 06/09/2023    8:21 AM 01/16/2023    1:44 PM 06/04/2022    9:17 AM  GAD 7 : Generalized Anxiety Score  Nervous, Anxious, on Edge 3 3 1 1   Control/stop worrying 3 2 1 1   Worry too much - different things 3 3 1 3   Trouble relaxing 3 2 1 2   Restless 2 1 1 1   Easily annoyed or irritable 1 2 1 1   Afraid - awful might happen 3 2 0 1  Total GAD 7 Score 18 15 6 10   Anxiety Difficulty Extremely difficult Somewhat difficult Somewhat difficult Very difficult       11/05/2023   10:39 AM 06/09/2023    8:12 AM 01/16/2023    1:44 PM  Depression screen PHQ 2/9  Decreased Interest 3 1 0  Down, Depressed, Hopeless 3 2 0  PHQ - 2 Score 6 3 0  Altered  sleeping 3 3 0  Tired, decreased energy 3 2 0  Change in appetite 2 2 0  Feeling bad or failure about yourself  3 1 0  Trouble concentrating 2 1 0  Moving slowly or fidgety/restless 1 1 0  Suicidal thoughts 0 0 0  PHQ-9 Score 20 13 0  Difficult doing work/chores Not difficult at all Somewhat difficult Not difficult at all    BP Readings from Last 3 Encounters:  11/05/23 112/68  10/02/23 122/84  06/09/23 112/70    Physical Exam Constitutional:      General: He is irritable.     Appearance: Normal appearance.  Cardiovascular:     Rate and Rhythm: Normal rate and regular rhythm.  Pulmonary:     Effort: Pulmonary effort is normal.     Breath sounds: No wheezing or rhonchi.  Musculoskeletal:     Cervical back: Normal range of motion.  Neurological:     Mental Status: He is alert.  Psychiatric:        Mood and Affect: Mood is anxious.        Speech: Speech normal.        Behavior: Behavior normal.        Thought Content: Thought content does not include suicidal ideation. Thought content does not include suicidal plan.        Cognition and Memory: Cognition normal.     Wt Readings from Last 3 Encounters:  11/05/23 177 lb 3.2 oz (80.4 kg)  10/02/23 186 lb (84.4 kg)  06/09/23 178 lb 12.8 oz (81.1 kg)    BP 112/68   Pulse 75   Ht 6' (1.829 m)   Wt 177 lb 3.2 oz (80.4 kg)   SpO2 99%   BMI 24.03 kg/m   Assessment and Plan:  Problem List Items Addressed This Visit       Unprioritized   Current moderate episode of major depressive disorder without prior episode (HCC) - Primary (Chronic)   Having more issues struggling with work-life balance; time management, etc OCD symptoms continue He admits to more anxiety than in the past. Recommend counseling services and begin Lexapro 10 mg Follow up in 4 weeks.      Relevant Medications   escitalopram (LEXAPRO) 10 MG tablet    Return in about 4 weeks (around 12/03/2023) for Depression.    Reubin Milan, MD [facility]  Health Primary Care and Sports Medicine [place]

## 2023-11-05 NOTE — Telephone Encounter (Signed)
 Copied from CRM [id number]. Topic: Clinical - Medication Refill >> Nov 05, 2023 11:52 AM Macon Large wrote: Most Recent Primary Care Visit:  Provider: Jerrol Banana  Department: [facility] [place]  Visit Type: OFFICE VISIT  Date: 10/02/2023  Medication: ALPRAZolam (XANAX) 0.25 MG tablet  Has the patient contacted their pharmacy? No  Is this the correct pharmacy for this prescription? Yes If no, delete pharmacy and type the correct one.  This is the patient's preferred pharmacy:  [id number], [place] - [address] [place] [address] Phone: [phone] Fax: [phone]   Has the prescription been filled recently? No  Is the patient out of the medication? Yes  Has the patient been seen for an appointment in the last year OR does the patient have an upcoming appointment? Yes  Can we respond through MyChart? Yes  Agent: Please be advised that Rx refills may take up to 3 business days. We ask that you follow-up with your pharmacy.

## 2023-11-06 NOTE — Telephone Encounter (Signed)
 Please review.  KP

## 2023-11-06 NOTE — Telephone Encounter (Signed)
 Requested medication (s) are due for refill today: yes  Requested medication (s) are on the active medication list: yes  Last refill:  09/18/23 #10/0  Future visit scheduled: yes  Notes to clinic:  Unable to refill per protocol, cannot delegate.      Requested Prescriptions  Pending Prescriptions Disp Refills   ALPRAZolam (XANAX) 0.25 MG tablet 10 tablet 0    Sig: Take 1 tablet (0.25 mg total) by mouth at bedtime.     Not Delegated - Psychiatry: Anxiolytics/Hypnotics 2 Failed - 11/06/2023  1:22 PM      Failed - This refill cannot be delegated      Failed - Urine Drug Screen completed in last 360 days      Passed - Patient is not pregnant      Passed - Valid encounter within last 6 months    Recent Outpatient Visits           1 month ago Respiratory illness with fever   [place] [facility] at [facility], Ocie Bob, MD   5 months ago Annual physical exam   [facility] Medicine at [facility], Nyoka Cowden, MD   9 months ago Irritant contact dermatitis due to plants, except food   [place] [facility] Medicine at [facility], Nyoka Cowden, MD   1 year ago Influenza A   [facility] at [facility], Nyoka Cowden, MD   1 year ago Annual physical exam   [facility] Medicine at [facility], Nyoka Cowden, MD       Future Appointments             In 3 weeks Judithann Graves Nyoka Cowden, MD [facility] Primary Care & Sports Medicine at [facility], [facility]

## 2023-11-08 MED ORDER — ALPRAZOLAM 0.25 MG PO TABS
0.2500 mg | ORAL_TABLET | Freq: Every day | ORAL | 0 refills | Status: DC
Start: 2023-11-08 — End: 2023-12-03

## 2023-12-03 ENCOUNTER — Ambulatory Visit: Payer: 59 | Admitting: Internal Medicine

## 2023-12-03 ENCOUNTER — Encounter: Payer: Self-pay | Admitting: Internal Medicine

## 2023-12-03 VITALS — BP 112/66 | HR 92 | Ht 72.0 in | Wt 175.5 lb

## 2023-12-03 DIAGNOSIS — F419 Anxiety disorder, unspecified: Secondary | ICD-10-CM | POA: Diagnosis not present

## 2023-12-03 DIAGNOSIS — M7701 Medial epicondylitis, right elbow: Secondary | ICD-10-CM

## 2023-12-03 DIAGNOSIS — F321 Major depressive disorder, single episode, moderate: Secondary | ICD-10-CM

## 2023-12-03 MED ORDER — ALPRAZOLAM 0.25 MG PO TABS
0.2500 mg | ORAL_TABLET | Freq: Every day | ORAL | 0 refills | Status: AC
Start: 2023-12-03 — End: ?

## 2023-12-03 NOTE — Assessment & Plan Note (Signed)
 Continue low dose Xanax at bedtime as needed

## 2023-12-03 NOTE — Progress Notes (Signed)
 Date:  12/03/2023   Name:  Nathan Guerrero [facility]   DOB:  11/08/1985   MRN:  [id number]   Chief Complaint: Depression  Depression        This is a chronic problem.  The current episode started more than 1 month ago.   The problem occurs daily.The problem is unchanged.     The symptoms are aggravated by work stress (has a court date tomorrow for issues steming from a [place]).  Treatments tried: he had vomiting and diarrhea on low dose Lexapro.   Review of Systems  Constitutional:  Negative for chills and fever.  Respiratory:  Negative for chest tightness and shortness of breath.   Cardiovascular:  Negative for chest pain.  Musculoskeletal:  Positive for arthralgias (right elbow discomfort).  Psychiatric/Behavioral:  Positive for depression, dysphoric mood and sleep disturbance. The patient is nervous/anxious.      Lab Results  Component Value Date   NA 138 06/09/2023   K 5.0 06/09/2023   CO2 24 06/09/2023   GLUCOSE 93 06/09/2023   BUN 12 06/09/2023   CREATININE 0.92 06/09/2023   CALCIUM 9.9 06/09/2023   EGFR 109 06/09/2023   GFRNONAA >60 09/15/2019   Lab Results  Component Value Date   CHOL 155 06/09/2023   HDL 68 06/09/2023   LDLCALC 67 06/09/2023   TRIG 111 06/09/2023   CHOLHDL 2.3 06/09/2023   Lab Results  Component Value Date   TSH 1.670 06/09/2023   No results found for: "HGBA1C" Lab Results  Component Value Date   WBC 6.8 06/09/2023   HGB 15.6 06/09/2023   HCT 47.5 06/09/2023   MCV 91 06/09/2023   PLT 289 06/09/2023   Lab Results  Component Value Date   ALT 37 06/09/2023   AST 31 06/09/2023   ALKPHOS 111 06/09/2023   BILITOT 0.5 06/09/2023   No results found for: "25OHVITD2", "25OHVITD3", "VD25OH"   Patient Active Problem List   Diagnosis Date Noted   Anxiety 12/03/2023   Complex tear of lateral meniscus of knee 06/04/2022   Current moderate episode of major depressive disorder without prior episode (HCC) 10/19/2020   Irritable bowel  syndrome 04/20/2018   Mouth ulcers 08/21/2016   Rotator cuff syndrome of left shoulder 04/25/2015    Allergies  Allergen Reactions   Lexapro [Escitalopram] Nausea And Vomiting    Past Surgical History:  Procedure Laterality Date   WISDOM TOOTH EXTRACTION      Social History   Tobacco Use   Smoking status: Never   Smokeless tobacco: Never  Vaping Use   Vaping status: Never Used  Substance Use Topics   Alcohol use: Yes    Alcohol/week: 0.0 standard drinks of alcohol   Drug use: No     Medication list has been reviewed and updated.  Current Meds  Medication Sig   acetaminophen (TYLENOL) 325 MG tablet Take 500 mg by mouth every 6 (six) hours as needed.    albuterol (VENTOLIN HFA) 108 (90 Base) MCG/ACT inhaler Inhale 2 puffs into the lungs every 6 (six) hours as needed for wheezing or shortness of breath.   loratadine (CLARITIN) 5 MG chewable tablet Chew 5 mg by mouth as needed.   ondansetron (ZOFRAN-ODT) 8 MG disintegrating tablet Take 1 tablet (8 mg total) by mouth every 8 (eight) hours as needed for nausea.   [DISCONTINUED] ALPRAZolam (XANAX) 0.25 MG tablet Take 1 tablet (0.25 mg total) by mouth at bedtime.   [DISCONTINUED] escitalopram (LEXAPRO) 10 MG tablet Take  1 tablet (10 mg total) by mouth daily.       12/03/2023   11:18 AM 11/05/2023   10:40 AM 06/09/2023    8:21 AM 01/16/2023    1:44 PM  GAD 7 : Generalized Anxiety Score  Nervous, Anxious, on Edge 3 3 3 1   Control/stop worrying 3 3 2 1   Worry too much - different things 3 3 3 1   Trouble relaxing 2 3 2 1   Restless 2 2 1 1   Easily annoyed or irritable 1 1 2 1   Afraid - awful might happen 3 3 2  0  Total GAD 7 Score 17 18 15 6   Anxiety Difficulty Extremely difficult Extremely difficult Somewhat difficult Somewhat difficult       12/03/2023   11:17 AM 11/05/2023   10:39 AM 06/09/2023    8:12 AM  Depression screen PHQ 2/9  Decreased Interest 2 3 1   Down, Depressed, Hopeless 2 3 2   PHQ - 2 Score 4 6 3    Altered sleeping 2 3 3   Tired, decreased energy 2 3 2   Change in appetite 2 2 2   Feeling bad or failure about yourself  2 3 1   Trouble concentrating 2 2 1   Moving slowly or fidgety/restless 2 1 1   Suicidal thoughts 2 0 0  PHQ-9 Score 18 20 13   Difficult doing work/chores Not difficult at all Not difficult at all Somewhat difficult    BP Readings from Last 3 Encounters:  12/03/23 112/66  11/05/23 112/68  10/02/23 122/84    Physical Exam Vitals and nursing note reviewed.  Constitutional:      General: He is not in acute distress.    Appearance: He is well-developed.  HENT:     Head: Normocephalic and atraumatic.  Pulmonary:     Effort: Pulmonary effort is normal. No respiratory distress.  Musculoskeletal:     Right elbow: No swelling or effusion. Normal range of motion. Tenderness present in medial epicondyle.  Skin:    General: Skin is warm and dry.     Findings: No rash.  Neurological:     Mental Status: He is alert and oriented to person, place, and time.  Psychiatric:        Mood and Affect: Mood normal.        Behavior: Behavior normal.     Wt Readings from Last 3 Encounters:  12/03/23 175 lb 8 oz (79.6 kg)  11/05/23 177 lb 3.2 oz (80.4 kg)  10/02/23 186 lb (84.4 kg)    BP 112/66   Pulse 92   Ht 6' (1.829 m)   Wt 175 lb 8 oz (79.6 kg)   SpO2 96%   BMI 23.80 kg/m   Assessment and Plan:  Problem List Items Addressed This Visit       Unprioritized   Current moderate episode of major depressive disorder without prior episode (HCC) - Primary (Chronic)   He did not tolerate Lexapro. He continues to take Xanax low dose PRN at HS Has been more stressed over recent issues at work Will consider alternative medications such as Effexor or Pristiq if needed      Relevant Medications   ALPRAZolam (XANAX) 0.25 MG tablet   Anxiety   Continue low dose Xanax at bedtime as needed      Relevant Medications   ALPRAZolam (XANAX) 0.25 MG tablet   Other Visit  Diagnoses       Golfer's elbow, right           No  follow-ups on file.    Reubin Milan, MD [address] [facility] Primary Care and Sports Medicine [place]

## 2023-12-03 NOTE — Assessment & Plan Note (Signed)
 He did not tolerate Lexapro. He continues to take Xanax low dose PRN at HS Has been more stressed over recent issues at work Will consider alternative medications such as Effexor or Pristiq if needed

## 2023-12-09 ENCOUNTER — Encounter: Payer: Self-pay | Admitting: Family Medicine

## 2023-12-09 ENCOUNTER — Ambulatory Visit: Admitting: Family Medicine

## 2023-12-09 VITALS — BP 110/88 | HR 73 | Ht 72.0 in | Wt 177.4 lb

## 2023-12-09 DIAGNOSIS — M7701 Medial epicondylitis, right elbow: Secondary | ICD-10-CM | POA: Diagnosis not present

## 2023-12-09 MED ORDER — DICLOFENAC SODIUM 75 MG PO TBEC: 75.0000 mg | DELAYED_RELEASE_TABLET | Freq: Two times a day (BID) | ORAL | 0 refills | Status: DC | PRN

## 2023-12-09 NOTE — Progress Notes (Signed)
 Primary Care / Sports Medicine Office Visit  Patient Information:  Patient ID: Nathan Guerrero, male DOB: 1986-02-20 Age: 38 y.o. MRN: [id number]   Nathan Guerrero is a pleasant 38 y.o. male presenting with the following:  Chief Complaint  Patient presents with   Elbow Pain    Chronic right elbow pain. Patient had injections in the past which helped. He would like one today. His elbow is very sore. Aggravating factors are lots of activity.     Vitals:   12/09/23 0821  BP: 110/88  Pulse: 73  SpO2: 98%   Vitals:   12/09/23 0821  Weight: 177 lb 6.4 oz (80.5 kg)  Height: 6' (1.829 m)   Body mass index is 24.06 kg/m.  No results found.   Independent interpretation of notes and tests performed by another provider:   None  Procedures performed:   None  Pertinent History, Exam, Impression, and Recommendations:   Problem List Items Addressed This Visit     Medial epicondylitis, right - Primary   History of Present Illness Nathan Guerrero is a 38 year old male who presents with recurrent elbow pain.  He experiences recurrent soreness in his right elbow, primarily around the medial epicondyle and proximal flexor forearm. The pain has increased recently, particularly around the tip of the elbow and extending into the upper forearm.  He has a history of receiving cortisone injections in his late teens and mid-twenties, which provided relief for a period. As of late, the soreness has returned. He recalls being previously diagnosed with early onset "tendonitis", which was managed with cortisone shots.  Currently, he is taking extra strength Tylenol 1000 mg regularly and has taken a dose this morning. 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  Physical Exam Musculoskeletal: Right elbow exhibits tenderness at the  medial epicondyle which recreates his stated symptoms.  Positive provocative testing for medial epicondylitis including resisted wrist flexion elicits medial pain. Non-tender lateral epicondyle.  Triceps tendon benign.  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 Previous cortisone injections provided temporary relief but did not address muscle tightness. Explained risks of cortisone injections, including potential tendon weakening. - Can start exercises once symptoms relatively controlled using the handout provided. - Prescribe diclofenac 75 mg, to be taken twice daily with food as needed.  Do not take any other NSAIDs while on this medication. - Advise obtaining a counterforce brace to offload stress from the tendon during activities.  Use as demonstrated in clinic today, remove for periods of rest and bedtime. - Schedule follow-up appointment with low threshold to advance to cortisone injection.      Relevant Medications   diclofenac (VOLTAREN) 75 MG EC tablet     Orders & Medications Medications:  Meds ordered this encounter  Medications   diclofenac (VOLTAREN) 75 MG EC tablet    Sig: Take 1 tablet (75 mg total) by mouth 2 (two) times daily as needed.    Dispense:  60 tablet    Refill:  0   No orders of the defined types were placed in this encounter.    No follow-ups on file.     Jerrol Banana, MD,  [facility]   Primary Care Sports Medicine Primary Care and Sports Medicine at [facility]

## 2023-12-09 NOTE — Patient Instructions (Signed)
 Patient Plan for Post-Visit Guidance  1. Medial Epicondylitis (Golfer's Elbow) Management:    - Begin exercises using the handout provided once symptoms are relatively controlled. Okay to perform stretches if tolerable.    - Take diclofenac 75 mg twice daily with food as needed. Avoid using any other NSAIDs while taking this medication. Okay to continue using Tylenol (acetaminophen)    - Obtain and use a counterforce brace during activities to reduce tendon stress. Remove the brace during rest and at bedtime.  2. Follow-up:    - Schedule a follow-up appointment. We will most likely advance to a cortisone injection if symptoms do not improve.  3. Important Symptoms to Watch For:    - If you experience increased pain, swelling, or any new symptoms, contact your healthcare provider promptly.

## 2023-12-09 NOTE — Assessment & Plan Note (Signed)
 History of Present Illness Nathan Guerrero is a 38 year old male who presents with recurrent elbow pain.  He experiences recurrent soreness in his right elbow, primarily around the medial epicondyle and proximal flexor forearm. The pain has increased recently, particularly around the tip of the elbow and extending into the upper forearm.  He has a history of receiving cortisone injections in his late teens and mid-twenties, which provided relief for a period. As of late, the soreness has returned. He recalls being previously diagnosed with early onset "tendonitis", which was managed with cortisone shots.  Currently, he is taking extra strength Tylenol 1000 mg regularly and has taken a dose this morning. 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  Physical Exam Musculoskeletal: Right elbow exhibits tenderness at the medial epicondyle which recreates his stated symptoms.  Positive provocative testing for medial epicondylitis including resisted wrist flexion elicits medial pain. Non-tender lateral epicondyle.  Triceps tendon benign.  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 Previous cortisone injections provided temporary relief but did not address muscle tightness. Explained risks of cortisone injections, including potential tendon weakening. - Can start exercises once symptoms relatively controlled using the handout provided. - Prescribe diclofenac 75 mg, to be taken twice daily with food as needed.  Do not take any other NSAIDs while on this medication. - Advise obtaining a counterforce brace to offload stress from the tendon during  activities.  Use as demonstrated in clinic today, remove for periods of rest and bedtime. - Schedule follow-up appointment with low threshold to advance to cortisone injection.

## 2023-12-10 ENCOUNTER — Other Ambulatory Visit (INDEPENDENT_AMBULATORY_CARE_PROVIDER_SITE_OTHER): Payer: Self-pay | Admitting: Radiology

## 2023-12-10 ENCOUNTER — Encounter: Payer: Self-pay | Admitting: Family Medicine

## 2023-12-10 ENCOUNTER — Ambulatory Visit (INDEPENDENT_AMBULATORY_CARE_PROVIDER_SITE_OTHER): Admitting: Family Medicine

## 2023-12-10 VITALS — BP 124/80 | HR 90 | Ht 72.0 in

## 2023-12-10 DIAGNOSIS — M7701 Medial epicondylitis, right elbow: Secondary | ICD-10-CM | POA: Diagnosis not present

## 2023-12-10 MED ORDER — TRIAMCINOLONE ACETONIDE 40 MG/ML IJ SUSP: 40.0000 mg | Freq: Once | INTRAMUSCULAR | Status: AC

## 2023-12-10 NOTE — Progress Notes (Signed)
 Primary Care / Sports Medicine Office Visit  Patient Information:  Patient ID: Nathan Guerrero, male DOB: Feb 04, 1986 Age: 38 y.o. MRN: [id number]   Nathan Guerrero is a pleasant 38 y.o. male presenting with the following:  Chief Complaint  Patient presents with   Elbow Pain    Right elbow pain. Patient request cortisone injection for today's visit.     Vitals:   12/10/23 0906  BP: 124/80  Pulse: 90  SpO2: 98%   Vitals:   12/10/23 0906  Height: 6' (1.829 m)   Body mass index is 24.06 kg/m.  No results found.   Independent interpretation of notes and tests performed by another provider:   None  Procedures performed:   Procedure:  Injection of right elbow under ultrasound guidance. Ultrasound guidance utilized for out of plane approach to the medial epicondyle, common flexor tendon visualized Samsung HS60 device utilized with permanent recording / reporting. Verbal informed consent obtained and verified. Skin prepped in a sterile fashion. Ethyl chloride for topical local analgesia.  Completed without difficulty and tolerated well. Medication: triamcinolone acetonide 40 mg/mL suspension for injection 1 mL total and 2 mL lidocaine 1% without epinephrine utilized for needle placement anesthetic Advised to contact for fevers/chills, erythema, induration, drainage, or persistent bleeding.   Pertinent History, Exam, Impression, and Recommendations:   Problem List Items Addressed This Visit     Medial epicondylitis, right - Primary   History of Present Illness Nathan Guerrero is a 38 year old male who presents with recurrent right elbow pain.  He attributes his recurrent right elbow pain to golfing activities. The pain has been persistent, leading to multiple cortisone injections in the past. He identifies a specific sore spot in the right elbow at the medial epicondyle.  He has received two cortisone injections in the right elbow  previously.  The pain affects his ability to play golf. He plans to rest and avoid golfing for a few days post-injection.  Physical Exam SPECIAL TESTS: Right elbow tenderness on palpation with a localized area of soreness.  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 Previous corticosteroid injections provided relief. Potential side effects include transient numbness and paresthesia due to ulnar nerve proximity. Relief expected within 12 to 72 hours, possibly up to 10 days. Consider imaging, repeat injections, or dry needling if symptoms persist beyond two weeks. - Administer corticosteroid injection with ultrasound guidance to the right elbow. - Advise icing the elbow for 20 minutes after returning home and before bedtime. - Continue diclofenac medication AM and PM until the corticosteroid takes effect.  Take this medication with food. - Instruct to use an elbow brace during physical activities if needed. - Encourage normal daily activities today and tomorrow to help distribute the medication. - Start flexibility exercises and light weight training after the weekend using the exercises previously provided. - Consider imaging, repeat injections, or dry needling if symptoms persist beyond two weeks.      Relevant Medications   triamcinolone acetonide (KENALOG-40) injection 40 mg   Other Relevant Orders   [place] LIMITED JOINT SPACE STRUCTURES UP RIGHT     Orders & Medications Medications:  Meds ordered this encounter  Medications   triamcinolone acetonide (KENALOG-40) injection 40 mg   Orders Placed This Encounter  Procedures   [place] LIMITED JOINT SPACE STRUCTURES UP RIGHT     No follow-ups on file.     Jerrol Banana, MD, [facility]  Primary Care Sports Medicine Primary Care and Sports Medicine at [facility]

## 2023-12-10 NOTE — Assessment & Plan Note (Signed)
 History of Present Illness Nathan Guerrero is a 38 year old male who presents with recurrent right elbow pain.  He attributes his recurrent right elbow pain to golfing activities. The pain has been persistent, leading to multiple cortisone injections in the past. He identifies a specific sore spot in the right elbow at the medial epicondyle.  He has received two cortisone injections in the right elbow previously.  The pain affects his ability to play golf. He plans to rest and avoid golfing for a few days post-injection.  Physical Exam SPECIAL TESTS: Right elbow tenderness on palpation with a localized area of soreness.  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 Previous corticosteroid injections provided relief. Potential side effects include transient numbness and paresthesia due to ulnar nerve proximity. Relief expected within 12 to 72 hours, possibly up to 10 days. Consider imaging, repeat injections, or dry needling if symptoms persist beyond two weeks. - Administer corticosteroid injection with ultrasound guidance to the right elbow. - Advise icing the elbow for 20 minutes after returning home and before bedtime. - Continue diclofenac medication AM and PM until the corticosteroid takes effect.  Take this medication with food. - Instruct to use an elbow brace during physical activities if needed. - Encourage normal daily activities today and tomorrow to help distribute the medication. - Start flexibility exercises and light weight training after the weekend using the exercises previously provided. - Consider imaging, repeat injections, or dry needling if symptoms persist beyond two weeks.

## 2023-12-10 NOTE — Patient Instructions (Signed)
 You have just been given a cortisone injection to reduce pain and inflammation. After the injection you may notice immediate relief of pain as a result of the Lidocaine. It is important to rest the area of the injection for 24 to 48 hours after the injection. There is a possibility of some temporary increased discomfort and swelling for up to 72 hours until the cortisone begins to work. If you do have pain, simply rest the joint and use ice. If you can tolerate over the counter medications, you can try Tylenol, Aleve, or Advil for added relief per package instructions.  Patient Plan for Post-Visit Guidance  1. Treatment for Medial Epicondylitis (Golfer's Elbow):    - Receive a corticosteroid injection in the right elbow.    - Ice the elbow for 20 minutes after returning home and before bedtime.    - Continue taking diclofenac in the morning and evening with food until the corticosteroid takes effect.    - Use an elbow brace during physical activities if needed.    - Engage in normal daily activities today and tomorrow to help distribute the medication.    - Begin flexibility exercises and light weight training after the weekend using the exercises previously provided.  2. Critical Symptoms to Watch For:    - If you experience increased pain, numbness, or tingling in the arm, contact your healthcare provider immediately.    - If symptoms persist beyond two weeks, consider additional options like imaging, repeat injections, or dry needling.  Please follow these steps and reach out if you have any questions or concerns.

## 2024-01-03 ENCOUNTER — Other Ambulatory Visit: Payer: Self-pay | Admitting: Internal Medicine

## 2024-01-03 DIAGNOSIS — F321 Major depressive disorder, single episode, moderate: Secondary | ICD-10-CM

## 2024-01-05 NOTE — Telephone Encounter (Signed)
 Refill not appropriate, Rx discontinued 12/03/23 by PCP due to side effects. Requested Prescriptions  Pending Prescriptions Disp Refills   escitalopram  (LEXAPRO ) 10 MG tablet [Pharmacy Med Name: ESCITALOPRAM  10 MG TABLET] 30 tablet 1    Sig: TAKE 1 TABLET BY MOUTH EVERY DAY     Psychiatry:  Antidepressants - SSRI Passed - 01/05/2024 10:35 AM      Passed - Completed PHQ-2 or PHQ-9 in the last 360 days      Passed - Valid encounter within last 6 months    Recent Outpatient Visits           3 weeks ago Medial epicondylitis, right   [place] [facility] at [facility], Dessie Flow, MD   3 weeks ago Medial epicondylitis, right   [facility] Primary Care & Sports Medicine at [facility], Dessie Flow, MD   1 month ago Current moderate episode of major depressive disorder without prior episode [facility])   [place] Primary Care & Sports Medicine at [facility], Chales [place], MD   2 months ago Current moderate episode of major depressive disorder without prior episode [facility])   [facility] Primary Care & Sports Medicine at [facility], Chales [place], MD

## 2024-01-18 ENCOUNTER — Other Ambulatory Visit: Payer: Self-pay | Admitting: Family Medicine

## 2024-01-18 DIAGNOSIS — M7701 Medial epicondylitis, right elbow: Secondary | ICD-10-CM

## 2024-01-19 NOTE — Telephone Encounter (Signed)
 Med refill

## 2024-01-19 NOTE — Telephone Encounter (Signed)
 Requested medications are due for refill today.  yes  Requested medications are on the active medications list.  yes  Last refill. 12/09/2023 #60 0 rf  Future visit scheduled.   no  Notes to clinic.  New medication to this pt. Please review for refill.    Requested Prescriptions  Pending Prescriptions Disp Refills   diclofenac  (VOLTAREN ) 75 MG EC tablet [Pharmacy Med Name: DICLOFENAC  SOD EC 75 MG TAB] 60 tablet 0    Sig: TAKE 1 TABLET BY MOUTH 2 TIMES DAILY AS NEEDED.     Analgesics:  NSAIDS Failed - 01/19/2024  2:34 PM      Failed - Manual Review: Labs are only required if the patient has taken medication for more than 8 weeks.      Passed - Cr in normal range and within 360 days    Creatinine, Ser  Date Value Ref Range Status  06/09/2023 0.92 0.76 - 1.27 mg/dL Final         Passed - HGB in normal range and within 360 days    Hemoglobin  Date Value Ref Range Status  06/09/2023 15.6 13.0 - 17.7 g/dL Final         Passed - PLT in normal range and within 360 days    Platelets  Date Value Ref Range Status  06/09/2023 289 150 - 450 x10E3/uL Final         Passed - HCT in normal range and within 360 days    Hematocrit  Date Value Ref Range Status  06/09/2023 47.5 37.5 - 51.0 % Final         Passed - eGFR is 30 or above and within 360 days    GFR calc Af Amer  Date Value Ref Range Status  09/15/2019 >60 >60 mL/min Final   GFR calc non Af Amer  Date Value Ref Range Status  09/15/2019 >60 >60 mL/min Final   eGFR  Date Value Ref Range Status  06/09/2023 109 >59 mL/min/1.73 Final         Passed - Patient is not pregnant      Passed - Valid encounter within last 12 months    Recent Outpatient Visits           1 month ago Medial epicondylitis, right    Primary Care & Sports Medicine at [facility] [place] [facility], Dessie Flow, MD   1 month ago Medial epicondylitis, right   [facility] Primary Care & Sports Medicine at [facility], Dessie Flow, MD    1 month ago Current moderate episode of major depressive disorder without prior episode [facility])    Primary Care & Sports Medicine at [facility], Chales [place], MD   2 months ago Current moderate episode of major depressive disorder without prior episode [facility])   [facility] Primary Care & Sports Medicine at [facility], Chales [place], MD

## 2024-01-20 NOTE — Telephone Encounter (Signed)
 Spoke with patient, he stated that he does not need the medication. He is doing very well with his elbow and is not using the medication as much.

## 2024-02-18 ENCOUNTER — Other Ambulatory Visit: Payer: Self-pay | Admitting: Internal Medicine

## 2024-02-18 DIAGNOSIS — F419 Anxiety disorder, unspecified: Secondary | ICD-10-CM

## 2024-02-18 NOTE — Telephone Encounter (Unsigned)
 Copied from CRM [id number]. Topic: Clinical - Medication Refill >> Feb 18, 2024 10:19 AM Sophia H wrote: Medication: ALPRAZolam  (XANAX ) 0.25 MG tablet **Patient will be leaving on business tomorrow mid day, requesting rx be sent prior. Ty    Has the patient contacted their pharmacy? Yes  This is the patient's preferred pharmacy:  [facility] [phone] [place], [place] - [address] [place] [address] Phone: [phone] Fax: [phone]  Is this the correct pharmacy for this prescription? Yes If no, delete pharmacy and type the correct one.   Has the prescription been filled recently? Yes  Is the patient out of the medication? Yes  Has the patient been seen for an appointment in the last year OR does the patient have an upcoming appointment? Yes  Can we respond through MyChart? No, please call patient   Agent: Please be advised that Rx refills may take up to 3 business days. We ask that you follow-up with your pharmacy.

## 2024-02-19 MED ORDER — ALPRAZOLAM 0.25 MG PO TABS: 0.2500 mg | ORAL_TABLET | Freq: Every day | ORAL | 0 refills | Status: DC

## 2024-02-19 NOTE — Telephone Encounter (Signed)
 Requested medication (s) are due for refill today: yes  Requested medication (s) are on the active medication list: yes  Last refill:  12/03/23 #15 tabs  Future visit scheduled: no  Notes to clinic:  med not delegated to NT to reorder this med   Requested Prescriptions  Pending Prescriptions Disp Refills   ALPRAZolam  (XANAX ) 0.25 MG tablet 15 tablet 0    Sig: Take 1 tablet (0.25 mg total) by mouth at bedtime.     Not Delegated - Psychiatry: Anxiolytics/Hypnotics 2 Failed - 02/19/2024  3:52 PM      Failed - This refill cannot be delegated      Failed - Urine Drug Screen completed in last 360 days      Passed - Patient is not pregnant      Passed - Valid encounter within last 6 months    Recent Outpatient Visits           2 months ago Medial epicondylitis, right   [place] [facility] at [facility], Dessie Flow, MD   2 months ago Medial epicondylitis, right   [facility] at [facility], Dessie Flow, MD   2 months ago Current moderate episode of major depressive disorder without prior episode [facility])   [place] [facility] at [facility], Chales [place], MD   3 months ago Current moderate episode of major depressive disorder without prior episode [facility])   [facility] Medicine at [facility], Chales [place], MD

## 2024-03-21 ENCOUNTER — Telehealth: Payer: Self-pay | Admitting: Internal Medicine

## 2024-03-21 NOTE — Telephone Encounter (Unsigned)
 Copied from CRM [id number]. Topic: Appointments - Appointment Scheduling >> Mar 21, 2024 12:14 PM Tiffini S wrote: Patient is calling to schedule an physical appointment for June- offered to schedule for the next available appointment in October- patient refused asking for call back from the clinic at [phone].

## 2024-03-22 ENCOUNTER — Ambulatory Visit (INDEPENDENT_AMBULATORY_CARE_PROVIDER_SITE_OTHER): Admitting: Internal Medicine

## 2024-03-22 ENCOUNTER — Encounter: Payer: Self-pay | Admitting: Internal Medicine

## 2024-03-22 VITALS — BP 122/78 | HR 67 | Ht 72.0 in | Wt 177.4 lb

## 2024-03-22 DIAGNOSIS — K588 Other irritable bowel syndrome: Secondary | ICD-10-CM

## 2024-03-22 DIAGNOSIS — Z0000 Encounter for general adult medical examination without abnormal findings: Secondary | ICD-10-CM | POA: Diagnosis not present

## 2024-03-22 DIAGNOSIS — Z131 Encounter for screening for diabetes mellitus: Secondary | ICD-10-CM | POA: Diagnosis not present

## 2024-03-22 DIAGNOSIS — Z13220 Encounter for screening for lipoid disorders: Secondary | ICD-10-CM

## 2024-03-22 DIAGNOSIS — F419 Anxiety disorder, unspecified: Secondary | ICD-10-CM | POA: Diagnosis not present

## 2024-03-22 DIAGNOSIS — S83272D Complex tear of lateral meniscus, current injury, left knee, subsequent encounter: Secondary | ICD-10-CM

## 2024-03-22 DIAGNOSIS — F321 Major depressive disorder, single episode, moderate: Secondary | ICD-10-CM | POA: Diagnosis not present

## 2024-03-22 MED ORDER — ALPRAZOLAM 0.25 MG PO TABS: 0.2500 mg | ORAL_TABLET | Freq: Every day | ORAL | 5 refills | Status: DC

## 2024-03-22 NOTE — Assessment & Plan Note (Signed)
 S/p left knee arthroscopy Avoid excessive stress to the knee; can take tylenol or Advil PRN

## 2024-03-22 NOTE — Progress Notes (Signed)
 Date:  03/22/2024   Name:  Nathan Guerrero [facility]   DOB:  December 21, 1985   MRN:  [id number]   Chief Complaint: Annual Exam Nathan Guerrero is a 37 y.o. male who presents today for his Complete Annual Exam. He feels well. He reports exercising. He reports he is sleeping well. 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 He is looking for another position now.  Health Maintenance  Topic Date Due   Hepatitis B Vaccine (1 of 3 - 19+ 3-dose series) Never done   COVID-19 Vaccine (3 - 2024-25 season) 05/10/2023   HPV Vaccine (1 - 3-dose SCDM series) 03/22/2025*   HIV Screening  03/22/2025*   Pneumococcal Vaccination (1 of 2 - PCV) 03/22/2029*   Flu Shot  04/08/2024   DTaP/Tdap/Td vaccine (2 - Td or Tdap) 06/04/2032   Hepatitis C Screening  Completed   Meningitis B Vaccine  Aged Out  *Topic was postponed. The date shown is not the original due date.    No results found for: PSA1, PSA   Gastroesophageal Reflux He complains of heartburn. He reports no abdominal pain, no chest pain, no coughing or no wheezing. This is a recurrent problem. The problem occurs occasionally. Pertinent negatives include no fatigue. Treatments tried: Bentyl . The treatment provided mild relief.  Anxiety Presents for follow-up visit. Symptoms include excessive worry and insomnia. Patient reports no chest pain, dizziness, nervous/anxious behavior, palpitations or shortness of breath. Symptoms occur occasionally. The severity of symptoms is mild.      Review of Systems  Constitutional:  Negative for fatigue and unexpected weight change.  HENT:  Negative for nosebleeds.   Eyes:  Negative for visual disturbance.  Respiratory:  Negative for cough, chest tightness, shortness of breath and wheezing.   Cardiovascular:  Negative for chest pain, palpitations and leg swelling.  Gastrointestinal:  Positive for heartburn. Negative for abdominal pain, constipation and diarrhea.  Genitourinary:   Negative for dysuria and hematuria.  Musculoskeletal:  Positive for arthralgias (mild knee pain). Negative for gait problem and joint swelling.  Neurological:  Negative for dizziness, weakness, light-headedness and headaches.  Psychiatric/Behavioral:  Positive for sleep disturbance. Negative for dysphoric mood. The patient has insomnia. The patient is not nervous/anxious.      Lab Results  Component Value Date   NA 138 06/09/2023   K 5.0 06/09/2023   CO2 24 06/09/2023   GLUCOSE 93 06/09/2023   BUN 12 06/09/2023   CREATININE 0.92 06/09/2023   CALCIUM 9.9 06/09/2023   EGFR 109 06/09/2023   GFRNONAA >60 09/15/2019   Lab Results  Component Value Date   CHOL 155 06/09/2023   HDL 68 06/09/2023   LDLCALC 67 06/09/2023   TRIG 111 06/09/2023   CHOLHDL 2.3 06/09/2023   Lab Results  Component Value Date   TSH 1.670 06/09/2023   No results found for: HGBA1C Lab Results  Component Value Date   WBC 6.8 06/09/2023   HGB 15.6 06/09/2023   HCT 47.5 06/09/2023   MCV 91 06/09/2023   PLT 289 06/09/2023   Lab Results  Component Value Date   ALT 37 06/09/2023   AST 31 06/09/2023   ALKPHOS 111 06/09/2023   BILITOT 0.5 06/09/2023   No results found for: MARIEN BOLLS, VD25OH   Patient Active Problem List   Diagnosis Date Noted   Medial epicondylitis, right 12/09/2023   Anxiety 12/03/2023   Complex tear of lateral meniscus of knee 06/04/2022   Current moderate episode of  major depressive disorder without prior episode (HCC) 10/19/2020   Irritable bowel syndrome 04/20/2018   Mouth ulcers 08/21/2016   Rotator cuff syndrome of left shoulder 04/25/2015    Allergies  Allergen Reactions   Lexapro  [Escitalopram ] Nausea And Vomiting    Past Surgical History:  Procedure Laterality Date   WISDOM TOOTH EXTRACTION      Social History   Tobacco Use   Smoking status: Never   Smokeless tobacco: Never  Vaping Use   Vaping status: Never Used  Substance Use Topics    Alcohol use: Yes    Alcohol/week: 0.0 standard drinks of alcohol   Drug use: No     Medication list has been reviewed and updated.  Current Meds  Medication Sig   acetaminophen (TYLENOL) 325 MG tablet Take 500 mg by mouth every 6 (six) hours as needed.    albuterol  (VENTOLIN  HFA) 108 (90 Base) MCG/ACT inhaler Inhale 2 puffs into the lungs every 6 (six) hours as needed for wheezing or shortness of breath.   diclofenac  (VOLTAREN ) 75 MG EC tablet Take 1 tablet (75 mg total) by mouth 2 (two) times daily as needed.   loratadine (CLARITIN) 5 MG chewable tablet Chew 5 mg by mouth as needed.   [DISCONTINUED] ALPRAZolam  (XANAX ) 0.25 MG tablet Take 1 tablet (0.25 mg total) by mouth at bedtime.   Current Facility-Administered Medications for the 03/22/24 encounter (Office Visit) with Justus Leita DEL, MD  Medication   triamcinolone  acetonide (KENALOG -40) injection 40 mg       03/22/2024    2:00 PM 12/03/2023   11:18 AM 11/05/2023   10:40 AM 06/09/2023    8:21 AM  GAD 7 : Generalized Anxiety Score  Nervous, Anxious, on Edge 0 3 3 3   Control/stop worrying 0 3 3 2   Worry too much - different things 0 3 3 3   Trouble relaxing 0 2 3 2   Restless 0 2 2 1   Easily annoyed or irritable 0 1 1 2   Afraid - awful might happen 0 3 3 2   Total GAD 7 Score 0 17 18 15   Anxiety Difficulty Not difficult at all Extremely difficult Extremely difficult Somewhat difficult       03/22/2024    1:59 PM 12/03/2023   11:17 AM 11/05/2023   10:39 AM  Depression screen PHQ 2/9  Decreased Interest 1 2 3   Down, Depressed, Hopeless 1 2 3   PHQ - 2 Score 2 4 6   Altered sleeping 1 2 3   Tired, decreased energy 1 2 3   Change in appetite 0 2 2  Feeling bad or failure about yourself  0 2 3  Trouble concentrating 0 2 2  Moving slowly or fidgety/restless 0 2 1  Suicidal thoughts 0 2 0  PHQ-9 Score 4 18 20   Difficult doing work/chores Not difficult at all Not difficult at all Not difficult at all    BP Readings from Last  3 Encounters:  03/22/24 122/78  12/10/23 124/80  12/09/23 110/88    Physical Exam Vitals and nursing note reviewed.  Constitutional:      Appearance: Normal appearance. He is well-developed.  HENT:     Head: Normocephalic.     Right Ear: Tympanic membrane, ear canal and external ear normal.     Left Ear: Tympanic membrane, ear canal and external ear normal.     Nose: Nose normal.  Eyes:     Conjunctiva/sclera: Conjunctivae normal.     Pupils: Pupils are equal, round, and reactive to light.  Neck:     Thyroid: No thyromegaly.     Vascular: No carotid bruit.  Cardiovascular:     Rate and Rhythm: Normal rate and regular rhythm.     Heart sounds: Normal heart sounds.  Pulmonary:     Effort: Pulmonary effort is normal.     Breath sounds: Normal breath sounds. No wheezing.  Chest:  Breasts:    Right: No mass.     Left: No mass.  Abdominal:     General: Bowel sounds are normal.     Palpations: Abdomen is soft.     Tenderness: There is no abdominal tenderness.  Musculoskeletal:        General: Normal range of motion.     Cervical back: Normal range of motion and neck supple.     Right knee: No swelling, deformity or effusion.     Left knee: No swelling, deformity or effusion.     Right lower leg: No edema.     Left lower leg: No edema.  Lymphadenopathy:     Cervical: No cervical adenopathy.  Skin:    General: Skin is warm and dry.     Capillary Refill: Capillary refill takes less than 2 seconds.  Neurological:     General: No focal deficit present.     Mental Status: He is alert and oriented to person, place, and time.     Deep Tendon Reflexes: Reflexes are normal and symmetric.  Psychiatric:        Attention and Perception: Attention normal.        Mood and Affect: Mood normal.        Thought Content: Thought content normal.     Wt Readings from Last 3 Encounters:  03/22/24 177 lb 6.4 oz (80.5 kg)  12/09/23 177 lb 6.4 oz (80.5 kg)  12/03/23 175 lb 8 oz (79.6 kg)     BP 122/78   Pulse 67   Ht 6' (1.829 m)   Wt 177 lb 6.4 oz (80.5 kg)   SpO2 98%   BMI 24.06 kg/m   Assessment and Plan:  Problem List Items Addressed This Visit       Unprioritized   Irritable bowel syndrome (Chronic)   Taking Bentyl  but having more acid related symptoms Recommend trial of otc Pepcid or Prilosec PRN      Current moderate episode of major depressive disorder without prior episode (HCC) (Chronic)   Doing well currently on no chronic medications Using xanax  prn to help with sleep      Relevant Medications   ALPRAZolam  (XANAX ) 0.25 MG tablet   Other Relevant Orders   TSH   Testosterone   Complex tear of lateral meniscus of knee   S/p left knee arthroscopy Avoid excessive stress to the knee; can take tylenol or Advil PRN      Anxiety   Relevant Medications   ALPRAZolam  (XANAX ) 0.25 MG tablet   Other Visit Diagnoses       Annual physical exam    -  Primary   up to date on immunizations no screenings are needed at this time family hx reviewed   Relevant Orders   CBC with Differential/Platelet   Comprehensive metabolic panel with GFR   Hemoglobin A1c   Lipid panel   TSH   Testosterone     Screening for diabetes mellitus       Relevant Orders   Hemoglobin A1c     Screening for lipid disorders       Relevant  Orders   Lipid panel       No follow-ups on file.    Leita HILARIO Adie, MD [facility] [place]

## 2024-03-22 NOTE — Assessment & Plan Note (Signed)
 Taking Bentyl  but having more acid related symptoms Recommend trial of otc Pepcid or Prilosec PRN

## 2024-03-22 NOTE — Assessment & Plan Note (Signed)
 Doing well currently on no chronic medications Using xanax  prn to help with sleep

## 2024-03-26 ENCOUNTER — Ambulatory Visit: Payer: Self-pay | Admitting: Internal Medicine

## 2024-03-26 LAB — COMPREHENSIVE METABOLIC PANEL WITH GFR
ALT: 13 IU/L (ref 0–44)
AST: 19 IU/L (ref 0–40)
Albumin: 4.8 g/dL (ref 4.1–5.1)
Alkaline Phosphatase: 94 IU/L (ref 44–121)
BUN/Creatinine Ratio: 17 (ref 9–20)
BUN: 16 mg/dL (ref 6–24)
Bilirubin Total: 0.4 mg/dL (ref 0.0–1.2)
CO2: 20 mmol/L (ref 20–29)
Calcium: 9.5 mg/dL (ref 8.7–10.2)
Chloride: 101 mmol/L (ref 96–106)
Creatinine, Ser: 0.93 mg/dL (ref 0.76–1.27)
Globulin, Total: 2.2 g/dL (ref 1.5–4.5)
Glucose: 94 mg/dL (ref 70–99)
Potassium: 4.8 mmol/L (ref 3.5–5.2)
Sodium: 137 mmol/L (ref 134–144)
Total Protein: 7 g/dL (ref 6.0–8.5)
eGFR: 106 mL/min/1.73 (ref >59)

## 2024-03-26 LAB — CBC WITH DIFFERENTIAL/PLATELET
Basophils Absolute: 0.1 x10E3/uL (ref 0.0–0.2)
Basos: 1 %
EOS (ABSOLUTE): 0.1 x10E3/uL (ref 0.0–0.4)
Eos: 2 %
Hematocrit: 44.5 % (ref 37.5–51.0)
Hemoglobin: 15.1 g/dL (ref 13.0–17.7)
Immature Grans (Abs): 0 x10E3/uL (ref 0.0–0.1)
Immature Granulocytes: 0 %
Lymphocytes Absolute: 1.8 x10E3/uL (ref 0.7–3.1)
Lymphs: 30 %
MCH: 30.3 pg (ref 26.6–33.0)
MCHC: 33.9 g/dL (ref 31.5–35.7)
MCV: 89 fL (ref 79–97)
Monocytes Absolute: 0.5 x10E3/uL (ref 0.1–0.9)
Monocytes: 8 %
Neutrophils Absolute: 3.5 x10E3/uL (ref 1.4–7.0)
Neutrophils: 59 %
Platelets: 276 x10E3/uL (ref 150–450)
RBC: 4.98 x10E6/uL (ref 4.14–5.80)
RDW: 12.2 % (ref 11.6–15.4)
WBC: 6 x10E3/uL (ref 3.4–10.8)

## 2024-03-26 LAB — LIPID PANEL
Chol/HDL Ratio: 2.1 ratio (ref 0.0–5.0)
Cholesterol, Total: 154 mg/dL (ref 100–199)
HDL: 74 mg/dL (ref >39)
LDL Chol Calc (NIH): 68 mg/dL (ref 0–99)
Triglycerides: 57 mg/dL (ref 0–149)
VLDL Cholesterol Cal: 12 mg/dL (ref 5–40)

## 2024-03-26 LAB — HEMOGLOBIN A1C
Est. average glucose Bld gHb Est-mCnc: 108 mg/dL
Hgb A1c MFr Bld: 5.4 % (ref 4.8–5.6)

## 2024-03-26 LAB — TSH: TSH: 1.74 u[IU]/mL (ref 0.450–4.500)

## 2024-03-26 LAB — TESTOSTERONE: Testosterone: 338 ng/dL (ref 264–916)

## 2024-04-25 ENCOUNTER — Other Ambulatory Visit: Payer: Self-pay | Admitting: Internal Medicine

## 2024-04-25 DIAGNOSIS — F321 Major depressive disorder, single episode, moderate: Secondary | ICD-10-CM

## 2024-04-27 NOTE — Telephone Encounter (Signed)
 D/C 12/03/23. Requested Prescriptions  Refused Prescriptions Disp Refills   escitalopram  (LEXAPRO ) 10 MG tablet [Pharmacy Med Name: ESCITALOPRAM  10 MG TABLET] 30 tablet 1    Sig: TAKE 1 TABLET BY MOUTH EVERY DAY     Psychiatry:  Antidepressants - SSRI Passed - 04/27/2024 12:31 PM      Passed - Completed PHQ-2 or PHQ-9 in the last 360 days      Passed - Valid encounter within last 6 months    Recent Outpatient Visits           1 month ago Annual physical exam   [place] [facility] at [facility], Leita DEL, MD   4 months ago Medial epicondylitis, right   [facility] at [facility], Selinda PARAS, MD   4 months ago Medial epicondylitis, right   [facility] at [facility], Selinda PARAS, MD   4 months ago Current moderate episode of major depressive disorder without prior episode [facility])   [place] [facility] at [facility], Leita DEL, MD   5 months ago Current moderate episode of major depressive disorder without prior episode [facility])   [facility] Medicine at [facility], Leita DEL, MD

## 2024-05-30 ENCOUNTER — Telehealth: Payer: Self-pay

## 2024-05-30 NOTE — Telephone Encounter (Signed)
 Called pt and left VM confirming per Dr Alvia that this appt is okay. Asked patient to call back to clarify the end of this message.  - Deavin Forst M.

## 2024-05-30 NOTE — Telephone Encounter (Signed)
 Copied from CRM [id number]. Topic: Clinical - Medication Question >> May 30, 2024 11:53 AM Emylou G wrote: Reason for CRM: Please call patient - he wants to do cortizone shot.. he is only available this Friday.. Is this okay?  I made an appt.SABRA He rather not do a referral.. ( if you can call him back today - so he can use someone else )

## 2024-06-01 ENCOUNTER — Telehealth: Payer: Self-pay

## 2024-06-01 NOTE — Telephone Encounter (Signed)
 Called patient left VM for patient to call back.  JM   Copied from CRM #[id number]. Topic: Appointments - Appointment Info/Confirmation >> Jun 01, 2024  9:42 AM Treva T wrote: Patient/patient representative is calling for information regarding an appointment.   Patient calling back office, [place] to verify and confirm appointment details for elbow injection with provider on 06/03/24, as per voicemail that was left by office.  Patient requested to send message to office to inform he received voicemail and is aware of appointment that is scheduled.

## 2024-06-03 ENCOUNTER — Encounter: Payer: Self-pay | Admitting: Family Medicine

## 2024-06-03 ENCOUNTER — Ambulatory Visit: Admitting: Family Medicine

## 2024-06-03 ENCOUNTER — Other Ambulatory Visit (INDEPENDENT_AMBULATORY_CARE_PROVIDER_SITE_OTHER): Payer: Self-pay | Admitting: Radiology

## 2024-06-03 DIAGNOSIS — M7701 Medial epicondylitis, right elbow: Secondary | ICD-10-CM

## 2024-06-03 MED ORDER — DICLOFENAC SODIUM 75 MG PO TBEC: 75.0000 mg | DELAYED_RELEASE_TABLET | Freq: Two times a day (BID) | ORAL | 0 refills | Status: DC | PRN

## 2024-06-05 NOTE — Progress Notes (Signed)
 Primary Care / Sports Medicine Office Visit  Patient Information:  Patient ID: Nathan Guerrero, male DOB: July 17, 1986 Age: 38 y.o. MRN: [id number]   Nathan Guerrero is a pleasant 38 y.o. male presenting with the following:  Chief Complaint  Patient presents with   Elbow Pain    Right elbow pain x 1 month. Aggravating factors lifting, compression, pressure,tennis, pickle ball,golf, basketball, and jogging. Patient takes ibu and tylenol for pain and inflammation helps some.     Vitals:   06/03/24 1320  BP: 98/68  Pulse: 64  SpO2: 99%   Vitals:   06/03/24 1320  Weight: 185 lb 12.8 oz (84.3 kg)  Height: 6' (1.829 m)   Body mass index is 25.2 kg/m.  No results found.   Discussed the use of AI scribe software for clinical note transcription with the patient, who gave verbal consent to proceed.   Independent interpretation of notes and tests performed by another provider:   None  Procedures performed:   Procedure:  Injection of right elbow under ultrasound guidance. Ultrasound guidance utilized for out-of-plane approach to medial epicondyle, no sonographic abnormalities noted Samsung HS60 device utilized with permanent recording / reporting. Verbal informed consent obtained and verified. Skin prepped in a sterile fashion. Ethyl chloride for topical local analgesia.  Completed without difficulty and tolerated well. Medication: triamcinolone  acetonide 40 mg/mL suspension for injection 1 mL total and 2 mL lidocaine  1% without epinephrine utilized for needle placement anesthetic Advised to contact for fevers/chills, erythema, induration, drainage, or persistent bleeding.   Pertinent History, Exam, Impression, and Recommendations:   Problem List Items Addressed This Visit     Medial epicondylitis, right   History of Present Illness Nathan Guerrero is a RHD 38 year old male with medial epicondylitis who presents with elbow pain.  Medial elbow  pain - Persistent pain localized to the inner aspect of the elbow, particularly with compression over the bone - Pain is exacerbated by flexion and participation in sports including pickleball, tennis, golf, basketball, lacrosse, and jogging - No associated inflammation, swelling, or discoloration - Pain significantly limits physical activities and sports participation  Therapeutic interventions and response - Receives cortisone injections for pain management, with relief lasting from three to six months, and up to twelve to fifteen months in the past - Most recent cortisone injection administered in April, approximately five months ago, with good relief - Previously attended physical therapy for shoulder issues, which was beneficial  Functional impact and activity modification - 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 Normal alignment. Skin intact. PALPATION: Focal tenderness over the medial epicondyle. Lateral epicondyle and triceps tendon nontender. No warmth, nodules, or effusion. RANGE OF MOTION: Full flexion, extension, supination, and pronation without limitation. Painful ROM. STRENGTH: 5/5 elbow flexion and extension. Pain reproduced with resisted wrist flexion and forearm pronation. Resisted wrist extension painless. NEUROVASCULAR: Sensation intact to light touch throughout the hand and forearm. Well-perfused. SPECIAL TESTS: Provocative testing consistent with medial epicondylitis (pain reproduced with resisted wrist flexion and pronation). Valgus stress test at the elbow stable  and without laxity.  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 Previous cortisone injections provided appropriate duration of relief. PT may offer long-term relief by improving flexibility and addressing muscle tightness. PRP and shockwave therapy discussed as additional options. - Administer cortisone injection to the right elbow today under ultrasound guidance. - Refer to physical therapy 1-2 times a week for 8 weeks. - Prescribe diclofenac  as needed for pain management. - Advise relative rest for two days post-injection, avoiding upper body activities. - Discuss PRP and shockwave therapy as potential future options if PT is insufficient.      Relevant Medications   diclofenac  (VOLTAREN ) 75 MG EC tablet   Other Relevant Orders   US  LIMITED JOINT SPACE STRUCTURES UP RIGHT   Ambulatory referral to Physical Therapy     Orders & Medications Medications:  Meds ordered this encounter  Medications   diclofenac  (VOLTAREN ) 75 MG EC tablet    Sig: Take 1 tablet (75 mg total) by mouth 2 (two) times daily as needed.    Dispense:  60 tablet    Refill:  0   Orders Placed This Encounter  Procedures   US  LIMITED JOINT SPACE STRUCTURES UP RIGHT   Ambulatory referral to Physical Therapy     No follow-ups on file.     Nathan JINNY Ku, MD, [facility]   Primary Care Sports Medicine [facility] and [facility] at [facility] [place]

## 2024-06-05 NOTE — Assessment & Plan Note (Signed)
 History of Present Illness Nathan Guerrero is a RHD 38 year old male with medial epicondylitis who presents with elbow pain.  Medial elbow pain - Persistent pain localized to the inner aspect of the elbow, particularly with compression over the bone - Pain is exacerbated by flexion and participation in sports including pickleball, tennis, golf, basketball, lacrosse, and jogging - No associated inflammation, swelling, or discoloration - Pain significantly limits physical activities and sports participation  Therapeutic interventions and response - Receives cortisone injections for pain management, with relief lasting from three to six months, and up to twelve to fifteen months in the past - Most recent cortisone injection administered in April, approximately five months ago, with good relief - Previously attended physical therapy for shoulder issues, which was beneficial  Functional impact and activity modification - 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 Normal alignment. Skin intact. PALPATION: Focal tenderness over the medial epicondyle. Lateral epicondyle and triceps tendon nontender. No warmth, nodules, or effusion. RANGE OF MOTION: Full flexion, extension, supination, and pronation without limitation. Painful ROM. STRENGTH: 5/5 elbow flexion and extension. Pain reproduced with resisted wrist flexion and forearm pronation. Resisted wrist extension painless. NEUROVASCULAR: Sensation intact to light touch throughout the hand and forearm. Well-perfused. SPECIAL TESTS:  Provocative testing consistent with medial epicondylitis (pain reproduced with resisted wrist flexion and pronation). Valgus stress test at the elbow stable and without laxity.  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 Previous cortisone injections provided appropriate duration of relief. PT may offer long-term relief by improving flexibility and addressing muscle tightness. PRP and shockwave therapy discussed as additional options. - Administer cortisone injection to the right elbow today under ultrasound guidance. - Refer to physical therapy 1-2 times a week for 8 weeks. - Prescribe diclofenac  as needed for pain management. - Advise relative rest for two days post-injection, avoiding upper body activities. - Discuss PRP and shockwave therapy as potential future options if PT is insufficient.

## 2024-06-05 NOTE — Patient Instructions (Signed)
 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 We discussed various treatment options to help manage your pain and improve your function.  YOUR PLAN: ACUTE ON CHRONIC MEDIAL EPICONDYLITIS OF RIGHT ELBOW: You have ongoing pain in the inner part of your right elbow, which is worsened by certain activities and sports. -We administered a cortisone injection to your right elbow today to help reduce the pain. -You are referred to physical therapy 1-2 times a week for 8 weeks to improve flexibility and address muscle tightness. -Use diclofenac  as needed for pain management. Take 1 tablet every 12 hours on an as-needed basis for symptom control. Take with food. -Rest your elbow for two days after the injection, avoiding any upper body activities. -We discussed PRP and shockwave therapy as potential future options if physical therapy does not provide sufficient relief.

## 2024-09-12 ENCOUNTER — Encounter: Payer: Self-pay | Admitting: Physician Assistant

## 2024-09-12 VITALS — BP 112/70 | HR 68 | Temp 97.9°F | Ht 72.0 in | Wt 190.0 lb

## 2024-09-12 DIAGNOSIS — L03011 Cellulitis of right finger: Secondary | ICD-10-CM

## 2024-09-12 DIAGNOSIS — F419 Anxiety disorder, unspecified: Secondary | ICD-10-CM

## 2024-09-12 DIAGNOSIS — L989 Disorder of the skin and subcutaneous tissue, unspecified: Secondary | ICD-10-CM

## 2024-09-12 MED ORDER — ALPRAZOLAM 0.25 MG PO TABS: 0.2500 mg | ORAL_TABLET | Freq: Every evening | ORAL | 3 refills | Status: AC | PRN

## 2024-09-12 MED ORDER — DOXYCYCLINE HYCLATE 100 MG PO TABS: 100.0000 mg | ORAL_TABLET | Freq: Two times a day (BID) | ORAL | 0 refills | Status: AC

## 2024-09-12 NOTE — Progress Notes (Signed)
 "   Date:  09/12/2024   Name:  Nathan Guerrero [facility]   DOB:  1985/12/24   MRN:  [id number]   Chief Complaint: Finger Injury (Right hand index finger redness located at the side of the nail bed, patient reports he did his research - thinks it is paroniquia - soaking his finger in warm water, took sterile needle to take out the pus. Feels like the infection is spreading to his right hand thumb and pinky )  HPI  Cindy presents for suspected paronychia as described above, says it started about 3 wk ago. Also the thumb and 5th finger seem to have some involvement too, though more recently.   Separately, he desires med refill for alprazolam  which he uses occasionally for sleep.  Also mentions a wart of the left heel present for the last 3 years which has not responded very well to self-management.  Medication list has been reviewed and updated.  Active Medications[1]   Review of Systems  Patient Active Problem List   Diagnosis Date Noted   Medial epicondylitis, right 12/09/2023   Anxiety 12/03/2023   Complex tear of lateral meniscus of knee 06/04/2022   Current moderate episode of major depressive disorder without prior episode (HCC) 10/19/2020   Irritable bowel syndrome 04/20/2018   Mouth ulcers 08/21/2016   Rotator cuff syndrome of left shoulder 04/25/2015    Allergies[2]  Immunization History  Administered Date(s) Administered   Meningococcal Conjugate 02/11/2008   PFIZER(Purple Top)SARS-COV-2 Vaccination 11/21/2019, 12/12/2019   Tdap 06/04/2022    Past Surgical History:  Procedure Laterality Date   WISDOM TOOTH EXTRACTION      Social History[3]  Family History  Problem Relation Age of Onset   Rashes / Skin problems Mother    Hypertension Mother    Healthy Father    Dementia Paternal Grandmother         03/22/2024    2:00 PM 12/03/2023   11:18 AM 11/05/2023   10:40 AM 06/09/2023    8:21 AM  GAD 7 : Generalized Anxiety Score  Nervous, Anxious, on Edge 0 3 3 3    Control/stop worrying 0 3 3 2   Worry too much - different things 0 3 3 3   Trouble relaxing 0 2 3 2   Restless 0 2 2 1   Easily annoyed or irritable 0 1 1 2   Afraid - awful might happen 0 3 3 2   Total GAD 7 Score 0 17 18 15   Anxiety Difficulty Not difficult at all Extremely difficult Extremely difficult Somewhat difficult       03/22/2024    1:59 PM 12/03/2023   11:17 AM 11/05/2023   10:39 AM  Depression screen PHQ 2/9  Decreased Interest 1 2 3   Down, Depressed, Hopeless 1 2 3   PHQ - 2 Score 2 4 6   Altered sleeping 1 2 3   Tired, decreased energy 1 2 3   Change in appetite 0 2 2  Feeling bad or failure about yourself  0 2 3  Trouble concentrating 0 2 2  Moving slowly or fidgety/restless 0 2 1  Suicidal thoughts 0 2 0  PHQ-9 Score 4  18  20    Difficult doing work/chores Not difficult at all Not difficult at all Not difficult at all     Data saved with a previous flowsheet row definition    BP Readings from Last 3 Encounters:  09/12/24 112/70  06/03/24 98/68  03/22/24 122/78    Wt Readings from Last 3 Encounters:  09/12/24 190 lb (  86.2 kg)  06/03/24 185 lb 12.8 oz (84.3 kg)  03/22/24 177 lb 6.4 oz (80.5 kg)    BP 112/70   Pulse 68   Temp 97.9 F (36.6 C) (Oral)   Ht 6' (1.829 m)   Wt 190 lb (86.2 kg)   SpO2 96%   BMI 25.77 kg/m   Physical Exam Vitals and nursing note reviewed.  Constitutional:      Appearance: Normal appearance.  Cardiovascular:     Rate and Rhythm: Normal rate.  Pulmonary:     Effort: Pulmonary effort is normal.  Abdominal:     General: There is no distension.  Musculoskeletal:        General: Normal range of motion.       Hands:     Comments: Obvious paronychia along the lateral nail of the right 2nd finger and to a lesser degree the lateral nail of the 5th finger.  The right thumb seems mildly edematous and tender but without signs of local infection.  Feet:     Comments: There is a 3 mm flesh-colored flat lesion of the left heel  favoring wart versus keratotic lesion Skin:    General: Skin is warm and dry.  Neurological:     Mental Status: He is alert and oriented to person, place, and time.     Gait: Gait is intact.  Psychiatric:        Mood and Affect: Mood and affect normal.     Recent Labs     Component Value Date/Time   NA 137 03/25/2024 0906   K 4.8 03/25/2024 0906   CL 101 03/25/2024 0906   CO2 20 03/25/2024 0906   GLUCOSE 94 03/25/2024 0906   GLUCOSE 100 (H) 09/15/2019 1446   BUN 16 03/25/2024 0906   CREATININE 0.93 03/25/2024 0906   CALCIUM 9.5 03/25/2024 0906   PROT 7.0 03/25/2024 0906   ALBUMIN 4.8 03/25/2024 0906   AST 19 03/25/2024 0906   ALT 13 03/25/2024 0906   ALKPHOS 94 03/25/2024 0906   BILITOT 0.4 03/25/2024 0906   GFRNONAA >60 09/15/2019 1446   GFRAA >60 09/15/2019 1446    Lab Results  Component Value Date   WBC 6.0 03/25/2024   HGB 15.1 03/25/2024   HCT 44.5 03/25/2024   MCV 89 03/25/2024   PLT 276 03/25/2024   Lab Results  Component Value Date   HGBA1C 5.4 03/25/2024   Lab Results  Component Value Date   CHOL 154 03/25/2024   HDL 74 03/25/2024   LDLCALC 68 03/25/2024   TRIG 57 03/25/2024   CHOLHDL 2.1 03/25/2024   Lab Results  Component Value Date   TSH 1.740 03/25/2024      Assessment and Plan:  1. Paronychia of finger of right hand (Primary) Patient education attached.  Salt soaks twice daily for the whole right hand.  Triple antibiotic every night before bed.  Due to multiple fingers being involved, will treat with oral doxycycline  as well.    As far as the etiology, this remains unclear as the patient does not pick at his fingers or bite his fingernails; it is conceivable that small cracks in the skin from dryness in the winter months could have caused this, although I will say that his fingers do not look particularly dry at time of exam.  - doxycycline  (VIBRA -TABS) 100 MG tablet; Take 1 tablet (100 mg total) by mouth 2 (two) times daily for 7 days.  Do not take with dairy. This medication INCREASES  SUN SENSITIVITY so avoid direct sunlight.  Dispense: 14 tablet; Refill: 0  2. Anxiety Refill alprazolam  for as needed use - ALPRAZolam  (XANAX ) 0.25 MG tablet; Take 1 tablet (0.25 mg total) by mouth at bedtime as needed for sleep.  Dispense: 15 tablet; Refill: 3  3. Heel lesion Referring to podiatry for further evaluation and management of this problem. - Ambulatory referral to Podiatry     No follow-ups on file.    Rolan Hoyle, PA-C, DMSc, DipACLM, Nutritionist [place] [facility] [phone]      [1]  Current Meds  Medication Sig   acetaminophen (TYLENOL) 325 MG tablet Take 500 mg by mouth every 6 (six) hours as needed.    albuterol  (VENTOLIN  HFA) 108 (90 Base) MCG/ACT inhaler Inhale 2 puffs into the lungs every 6 (six) hours as needed for wheezing or shortness of breath.   diclofenac  (VOLTAREN ) 75 MG EC tablet Take 1 tablet (75 mg total) by mouth 2 (two) times daily as needed.   doxycycline  (VIBRA -TABS) 100 MG tablet Take 1 tablet (100 mg total) by mouth 2 (two) times daily for 7 days. Do not take with dairy. This medication INCREASES SUN SENSITIVITY so avoid direct sunlight.   loratadine (CLARITIN) 5 MG chewable tablet Chew 5 mg by mouth as needed.   [DISCONTINUED] ALPRAZolam  (XANAX ) 0.25 MG tablet Take 1 tablet (0.25 mg total) by mouth at bedtime. (Patient taking differently: Take 0.25 mg by mouth as needed.)   Current Facility-Administered Medications for the 09/12/24 encounter (Office Visit) with Hoyle Toribio SQUIBB, PA  Medication   triamcinolone  acetonide (KENALOG -40) injection 40 mg  [2]  Allergies Allergen Reactions   Lexapro  [Escitalopram ] Nausea And Vomiting  [3]  Social History Tobacco Use   Smoking status: Never   Smokeless tobacco: Never  Vaping Use   Vaping status: Never Used  Substance Use Topics   Alcohol use: Yes    Alcohol/week:  0.0 standard drinks of alcohol   Drug use: No   "

## 2024-09-27 ENCOUNTER — Telehealth: Payer: Self-pay | Admitting: Physician Assistant

## 2024-09-27 ENCOUNTER — Ambulatory Visit: Admitting: Podiatry

## 2024-09-27 NOTE — Telephone Encounter (Signed)
 Left voice mail

## 2024-09-27 NOTE — Telephone Encounter (Signed)
 FYI I did just attempt to call patient. I LVM for him. I am more than happy to see him as presently scheduled with me tomorrow, however it may be best for him to see Dr. Alvia instead as this is a back injury and he already has a rapport with Dr. Alvia. He has a slot at 11 tomorrow morning available; should the patient decide to see him instead I have advised patient to either call or ideally, send a MyChart message instead.

## 2024-09-27 NOTE — Telephone Encounter (Signed)
 Copied from CRM [id number]. Topic: Appointments - Appointment Scheduling >> Sep 27, 2024 12:58 PM Nathan Guerrero wrote: The patient called in wanting to schedule an appointment with his provider due to back pain he sustained from lifting something Saturday morning. I will assist with scheduling and confirm the details. He was kind of demanding needing to be seen today and states I know it is after 1 but isn't that what my doctor is for emergency appointments. I told him I could schedule him for tomorrow and put him on the wait list and also told him he could go to [facility] to be seen today or Urgent Care or the ER and he said none of those is an option and that he hopes there is a cancellation today. He also wanted nothing to do with being triaged. I also spoke with Alan about it who spoke with his provider. I scheduled him for 9:20 tomorrow morning 09/28/24.

## 2024-09-27 NOTE — Telephone Encounter (Signed)
 Please call pt to see if he ants to see Dr. Alvia tomorrow. Per [employer].  KP

## 2024-09-27 NOTE — Telephone Encounter (Signed)
 Noted  KP

## 2024-09-28 ENCOUNTER — Encounter: Payer: Self-pay | Admitting: Physician Assistant

## 2024-09-28 ENCOUNTER — Ambulatory Visit: Admitting: Physician Assistant

## 2024-09-28 VITALS — BP 110/76 | HR 100 | Temp 97.9°F | Ht 72.0 in | Wt 189.0 lb

## 2024-09-28 DIAGNOSIS — S39012A Strain of muscle, fascia and tendon of lower back, initial encounter: Secondary | ICD-10-CM | POA: Diagnosis not present

## 2024-09-28 MED ORDER — DICLOFENAC SODIUM 75 MG PO TBEC: 75.0000 mg | DELAYED_RELEASE_TABLET | Freq: Two times a day (BID) | ORAL | 0 refills | Status: AC

## 2024-09-28 MED ORDER — CYCLOBENZAPRINE HCL 10 MG PO TABS: 10.0000 mg | ORAL_TABLET | Freq: Three times a day (TID) | ORAL | 0 refills | Status: AC | PRN

## 2024-09-28 NOTE — Progress Notes (Signed)
 "   Date:  09/28/2024   Name:  Loring Liskey [facility]   DOB:  Sep 10, 1985   MRN:  [id number]   Chief Complaint: Back Pain  Back Pain This is a new problem. Episode onset: X4 days. The problem occurs constantly. The problem has been waxing and waning since onset. The pain is present in the lumbar spine. The quality of the pain is described as shooting and stabbing (sharp). Radiates to: down both legs. The pain is at a severity of 6/10. The pain is moderate. Worse during: with movement. The symptoms are aggravated by twisting, bending and position. Stiffness is present In the morning. Associated symptoms include weakness. (Leg weakness) He has tried ice, bed rest and NSAIDs (tylenol) for the symptoms. The treatment provided mild relief.    Nathan Guerrero presents today for evaluation of acute back injury which occurred 4 days ago on Saturday when lifting his daughter as part of a father-daughter dance/challenge.  Daughter weighs 36 lbs and he has been lifting her in various ways 4-5 times leading up to the injury, which occurred when he lifted her straight up from a standing position and flipped her onto his back.  He immediately felt mid lower back pain causing him to sit down immediately.  The pain worsened the following day and has persisted since.  He endorses weakness in bilateral quadriceps, though it is unclear if this is true muscle weakness or simply limited due to pain. No loss of bowel/bladder function. Patient wearing a core/back brace to assist with gait.  Has been putting ice on the back, also taking Tylenol and ibuprofen (staggered) although this has not helped very much.  Fingers have improved most of the way compared to last time, not completely resolved.   Medication list has been reviewed and updated.  Active Medications[1]   Review of Systems  Musculoskeletal:  Positive for back pain.  Neurological:  Positive for weakness.    Patient Active Problem List   Diagnosis Date Noted    Medial epicondylitis, right 12/09/2023   Anxiety 12/03/2023   Complex tear of lateral meniscus of knee 06/04/2022   Current moderate episode of major depressive disorder without prior episode (HCC) 10/19/2020   Irritable bowel syndrome 04/20/2018   Mouth ulcers 08/21/2016   Rotator cuff syndrome of left shoulder 04/25/2015    Allergies[2]  Immunization History  Administered Date(s) Administered   Meningococcal Conjugate 02/11/2008   PFIZER(Purple Top)SARS-COV-2 Vaccination 11/21/2019, 12/12/2019   Tdap 06/04/2022    Past Surgical History:  Procedure Laterality Date   WISDOM TOOTH EXTRACTION      Social History[3]  Family History  Problem Relation Age of Onset   Rashes / Skin problems Mother    Hypertension Mother    Healthy Father    Dementia Paternal Grandmother         03/22/2024    2:00 PM 12/03/2023   11:18 AM 11/05/2023   10:40 AM 06/09/2023    8:21 AM  GAD 7 : Generalized Anxiety Score  Nervous, Anxious, on Edge 0  3  3  3    Control/stop worrying 0  3  3  2    Worry too much - different things 0  3  3  3    Trouble relaxing 0  2  3  2    Restless 0  2  2  1    Easily annoyed or irritable 0  1  1  2    Afraid - awful might happen 0  3  3  2  Total GAD 7 Score 0 17 18 15   Anxiety Difficulty Not difficult at all Extremely difficult Extremely difficult Somewhat difficult     Data saved with a previous flowsheet row definition       03/22/2024    1:59 PM 12/03/2023   11:17 AM 11/05/2023   10:39 AM  Depression screen PHQ 2/9  Decreased Interest 1 2 3   Down, Depressed, Hopeless 1 2 3   PHQ - 2 Score 2 4 6   Altered sleeping 1 2 3   Tired, decreased energy 1 2 3   Change in appetite 0 2 2  Feeling bad or failure about yourself  0 2 3  Trouble concentrating 0 2 2  Moving slowly or fidgety/restless 0 2 1  Suicidal thoughts 0 2 0  PHQ-9 Score 4  18  20    Difficult doing work/chores Not difficult at all Not difficult at all Not difficult at all     Data saved with a  previous flowsheet row definition    BP Readings from Last 3 Encounters:  09/28/24 110/76  09/12/24 112/70  06/03/24 98/68    Wt Readings from Last 3 Encounters:  09/28/24 189 lb (85.7 kg)  09/12/24 190 lb (86.2 kg)  06/03/24 185 lb 12.8 oz (84.3 kg)    BP 110/76   Pulse 100   Temp 97.9 F (36.6 C)   Ht 6' (1.829 m)   Wt 189 lb (85.7 kg)   SpO2 97%   BMI 25.63 kg/m   Physical Exam Vitals and nursing note reviewed.  Constitutional:      Appearance: Normal appearance.  Cardiovascular:     Rate and Rhythm: Normal rate.  Pulmonary:     Effort: Pulmonary effort is normal.  Abdominal:     General: There is no distension.  Musculoskeletal:        General: Normal range of motion.     Comments: Gait is antalgic and somewhat kyphotic.  No midline spinal tenderness.  No tenderness to palpation of the musculature while seated.  ROM in the thoracic and lumbar spine is limited due to pain, especially in flexion and extension.  Strength at the level of the hip is 5/5 bilaterally.  Flexion at the knee is 5/5 bilaterally, but he is unable to extend due to pain in the back when he tries.  Skin:    General: Skin is warm and dry.  Neurological:     Mental Status: He is alert and oriented to person, place, and time.  Psychiatric:        Mood and Affect: Mood and affect normal.     Recent Labs     Component Value Date/Time   NA 137 03/25/2024 0906   K 4.8 03/25/2024 0906   CL 101 03/25/2024 0906   CO2 20 03/25/2024 0906   GLUCOSE 94 03/25/2024 0906   GLUCOSE 100 (H) 09/15/2019 1446   BUN 16 03/25/2024 0906   CREATININE 0.93 03/25/2024 0906   CALCIUM 9.5 03/25/2024 0906   PROT 7.0 03/25/2024 0906   ALBUMIN 4.8 03/25/2024 0906   AST 19 03/25/2024 0906   ALT 13 03/25/2024 0906   ALKPHOS 94 03/25/2024 0906   BILITOT 0.4 03/25/2024 0906   GFRNONAA >60 09/15/2019 1446   GFRAA >60 09/15/2019 1446    Lab Results  Component Value Date   WBC 6.0 03/25/2024   HGB 15.1 03/25/2024    HCT 44.5 03/25/2024   MCV 89 03/25/2024   PLT 276 03/25/2024   Lab Results  Component Value Date   HGBA1C 5.4 03/25/2024   Lab Results  Component Value Date   CHOL 154 03/25/2024   HDL 74 03/25/2024   LDLCALC 68 03/25/2024   TRIG 57 03/25/2024   CHOLHDL 2.1 03/25/2024   Lab Results  Component Value Date   TSH 1.740 03/25/2024        Assessment & Plan Back strain, initial encounter Moderate-severe muscle strain of thoracolumbar musculature. Treat with diclofenac  and cyclobenzaprine  as below. Reviewed other measures to include local heat, massage, and gentle stretching. I think patient would particularly benefit from happy baby and child's pose among other stretching techniques as tolerated.   Orders:   diclofenac  (VOLTAREN ) 75 MG EC tablet; Take 1 tablet (75 mg total) by mouth 2 (two) times daily.   cyclobenzaprine  (FLEXERIL ) 10 MG tablet; Take 1 tablet (10 mg total) by mouth 3 (three) times daily as needed for muscle spasms.      Rolan Hoyle, PA-C, DMSc, DipACLM, Nutritionist [place] [facility] [phone]      [1]  Current Meds  Medication Sig   acetaminophen (TYLENOL) 325 MG tablet Take 500 mg by mouth every 6 (six) hours as needed.    albuterol  (VENTOLIN  HFA) 108 (90 Base) MCG/ACT inhaler Inhale 2 puffs into the lungs every 6 (six) hours as needed for wheezing or shortness of breath.   ALPRAZolam  (XANAX ) 0.25 MG tablet Take 1 tablet (0.25 mg total) by mouth at bedtime as needed for sleep.   loratadine (CLARITIN) 5 MG chewable tablet Chew 5 mg by mouth as needed.   Current Facility-Administered Medications for the 09/28/24 encounter (Office Visit) with Hoyle Toribio SQUIBB, PA  Medication   triamcinolone  acetonide (KENALOG -40) injection 40 mg  [2]  Allergies Allergen Reactions   Lexapro  [Escitalopram ] Nausea And Vomiting  [3]  Social History Tobacco Use   Smoking status: Never    Smokeless tobacco: Never  Vaping Use   Vaping status: Never Used  Substance Use Topics   Alcohol use: Yes    Alcohol/week: 0.0 standard drinks of alcohol   Drug use: No   "

## 2024-09-29 ENCOUNTER — Ambulatory Visit: Admitting: Podiatry

## 2024-09-29 DIAGNOSIS — L989 Disorder of the skin and subcutaneous tissue, unspecified: Secondary | ICD-10-CM | POA: Diagnosis not present

## 2024-09-29 NOTE — Progress Notes (Signed)
"  °  Subjective:  Patient ID: Nathan Guerrero, male    DOB: [birth year],  MRN: [id number]  Chief Complaint  Patient presents with   Plantar Warts    38 y.o. male presents with the above complaint.  Patient presents with complaint of left heel benign skin lesion/plantar verruca.  He states that has been there for a little time is causing him more more discomfort.  He may have stepped currently denies any other acute complaints.  He states been present for quite some time.  He would like to discuss treatment options for it pain scale 7 out of 10 dull aching nature.   Review of Systems: Negative except as noted in the HPI. Denies N/V/F/Ch.  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 There is no height or weight on file to calculate BMI. Constitutional Well developed. Well nourished.  Vascular Dorsalis pedis pulses palpable bilaterally. Posterior tibial pulses palpable bilaterally. Capillary refill normal to all digits.  No cyanosis or clubbing noted. Pedal hair growth normal.  Neurologic Normal speech. Oriented to person, place, and time. Epicritic sensation to light touch grossly present bilaterally.  Dermatologic Left toe plantar verruca with pinpoint bleeding noted upon debridement.  No central nucleated core noted.  No other abnormalities identified no open wounds or lesion noted.  Orthopedic: Normal joint ROM without pain or crepitus bilaterally. No visible deformities. No bony tenderness.   Radiographs: None Assessment:  No diagnosis found. Plan:  Patient was evaluated and treated and all questions answered.  Left heel plantar verruca --Lesion was debrided today without complications. Hemostasis was achieved and the area was  cleaned. Cantharone was applied followed by an occlusive bandage. Post procedure complications were discussed. Monitor for signs or symptoms of infection and directed to call the office mainly should any occur.  -  No follow-ups on file.    [1]  Current Outpatient Medications:    acetaminophen (TYLENOL) 325 MG tablet, Take 500 mg by mouth every 6 (six) hours as needed. , Disp: , Rfl:    albuterol  (VENTOLIN  HFA) 108 (90 Base) MCG/ACT inhaler, Inhale 2 puffs into the lungs every 6 (six) hours as needed for wheezing or shortness of breath., Disp: 1 each, Rfl: 0   ALPRAZolam  (XANAX ) 0.25 MG tablet, Take 1 tablet (0.25 mg total) by mouth at bedtime as needed for sleep., Disp: 15 tablet, Rfl: 3   cyclobenzaprine  (FLEXERIL ) 10 MG tablet, Take 1 tablet (10 mg total) by mouth 3 (three) times daily as needed for muscle spasms., Disp: 30 tablet, Rfl: 0   diclofenac  (VOLTAREN ) 75 MG EC tablet, Take 1 tablet (75 mg total) by mouth 2 (two) times daily., Disp: 30 tablet, Rfl: 0   loratadine (CLARITIN) 5 MG chewable tablet, Chew 5 mg by mouth as needed., Disp: , Rfl:   Current Facility-Administered Medications:    triamcinolone  acetonide (KENALOG -40) injection 40 mg, 40 mg, Intramuscular, Once,  [2]  Social History Tobacco Use  Smoking Status Never  Smokeless Tobacco Never  [3]  Allergies Allergen Reactions   Lexapro  [Escitalopram ] Nausea And Vomiting   "

## 2024-09-30 ENCOUNTER — Encounter: Admitting: Physician Assistant

## 2024-10-18 ENCOUNTER — Ambulatory Visit: Admitting: Podiatry

## 2025-03-24 ENCOUNTER — Encounter: Admitting: Physician Assistant
